# Patient Record
Sex: Female | Born: 1940 | Race: White | Hispanic: No | Marital: Married | Smoking: Never smoker
Health system: Southern US, Community
[De-identification: ages and names within clinical notes are randomized; demographics above are authoritative.]

## PROBLEM LIST (undated history)

## (undated) DIAGNOSIS — Z780 Asymptomatic menopausal state: Secondary | ICD-10-CM

## (undated) DIAGNOSIS — Z9889 Other specified postprocedural states: Secondary | ICD-10-CM

## (undated) DIAGNOSIS — IMO0001 Reserved for inherently not codable concepts without codable children: Secondary | ICD-10-CM

## (undated) DIAGNOSIS — N6310 Unspecified lump in the right breast, unspecified quadrant: Secondary | ICD-10-CM

## (undated) DIAGNOSIS — M8580 Other specified disorders of bone density and structure, unspecified site: Secondary | ICD-10-CM

## (undated) DIAGNOSIS — R112 Nausea with vomiting, unspecified: Secondary | ICD-10-CM

## (undated) DIAGNOSIS — I209 Angina pectoris, unspecified: Secondary | ICD-10-CM

## (undated) DIAGNOSIS — E785 Hyperlipidemia, unspecified: Secondary | ICD-10-CM

## (undated) DIAGNOSIS — K5792 Diverticulitis of intestine, part unspecified, without perforation or abscess without bleeding: Secondary | ICD-10-CM

## (undated) DIAGNOSIS — Z9109 Other allergy status, other than to drugs and biological substances: Secondary | ICD-10-CM

## (undated) DIAGNOSIS — G4730 Sleep apnea, unspecified: Secondary | ICD-10-CM

## (undated) DIAGNOSIS — E669 Obesity, unspecified: Secondary | ICD-10-CM

## (undated) DIAGNOSIS — Z8249 Family history of ischemic heart disease and other diseases of the circulatory system: Secondary | ICD-10-CM

## (undated) DIAGNOSIS — R079 Chest pain, unspecified: Secondary | ICD-10-CM

## (undated) DIAGNOSIS — M1990 Unspecified osteoarthritis, unspecified site: Secondary | ICD-10-CM

## (undated) DIAGNOSIS — I10 Essential (primary) hypertension: Secondary | ICD-10-CM

## (undated) DIAGNOSIS — I499 Cardiac arrhythmia, unspecified: Secondary | ICD-10-CM

## (undated) DIAGNOSIS — G629 Polyneuropathy, unspecified: Secondary | ICD-10-CM

## (undated) DIAGNOSIS — C44320 Squamous cell carcinoma of skin of unspecified parts of face: Secondary | ICD-10-CM

## (undated) DIAGNOSIS — K5900 Constipation, unspecified: Secondary | ICD-10-CM

## (undated) HISTORY — DX: Other allergy status, other than to drugs and biological substances: Z91.09

## (undated) HISTORY — DX: Chest pain, unspecified: R07.9

## (undated) HISTORY — PX: ABDOMINAL HYSTERECTOMY: SHX81

## (undated) HISTORY — DX: Family history of ischemic heart disease and other diseases of the circulatory system: Z82.49

## (undated) HISTORY — DX: Obesity, unspecified: E66.9

## (undated) HISTORY — DX: Squamous cell carcinoma of skin of unspecified parts of face: C44.320

## (undated) HISTORY — DX: Other specified disorders of bone density and structure, unspecified site: M85.80

## (undated) HISTORY — DX: Unspecified lump in the right breast, unspecified quadrant: N63.10

## (undated) HISTORY — PX: EVALUATION UNDER ANESTHESIA WITH STRABISMUS REPAIR: SHX5619

## (undated) HISTORY — DX: Hyperlipidemia, unspecified: E78.5

## (undated) HISTORY — PX: TONSILLECTOMY AND ADENOIDECTOMY: SHX28

## (undated) HISTORY — DX: Polyneuropathy, unspecified: G62.9

## (undated) HISTORY — PX: TONSILLECTOMY: SUR1361

## (undated) HISTORY — DX: Asymptomatic menopausal state: Z78.0

## (undated) HISTORY — PX: COLONOSCOPY: SHX174

## (undated) HISTORY — PX: BREAST EXCISIONAL BIOPSY: SUR124

## (undated) HISTORY — PX: BACK SURGERY: SHX140

## (undated) HISTORY — DX: Sleep apnea, unspecified: G47.30

## (undated) HISTORY — DX: Essential (primary) hypertension: I10

---

## 1984-06-28 HISTORY — PX: ABDOMINAL HYSTERECTOMY: SUR658

## 1997-03-28 HISTORY — PX: BREAST FIBROADENOMA SURGERY: SHX580

## 1998-01-22 ENCOUNTER — Other Ambulatory Visit: Admission: RE | Admit: 1998-01-22 | Discharge: 1998-01-22 | Payer: Self-pay | Admitting: Family Medicine

## 1999-02-05 ENCOUNTER — Other Ambulatory Visit: Admission: RE | Admit: 1999-02-05 | Discharge: 1999-02-05 | Payer: Self-pay | Admitting: *Deleted

## 2001-01-25 ENCOUNTER — Encounter: Payer: Self-pay | Admitting: Family Medicine

## 2001-01-25 ENCOUNTER — Ambulatory Visit (HOSPITAL_COMMUNITY): Admission: RE | Admit: 2001-01-25 | Discharge: 2001-01-25 | Payer: Self-pay | Admitting: Family Medicine

## 2002-02-02 ENCOUNTER — Encounter: Payer: Self-pay | Admitting: Family Medicine

## 2002-02-02 ENCOUNTER — Encounter: Admission: RE | Admit: 2002-02-02 | Discharge: 2002-02-02 | Payer: Self-pay | Admitting: Family Medicine

## 2003-07-08 ENCOUNTER — Encounter: Admission: RE | Admit: 2003-07-08 | Discharge: 2003-07-08 | Payer: Self-pay | Admitting: Family Medicine

## 2005-04-26 ENCOUNTER — Other Ambulatory Visit: Admission: RE | Admit: 2005-04-26 | Discharge: 2005-04-26 | Payer: Self-pay | Admitting: Family Medicine

## 2005-05-14 ENCOUNTER — Ambulatory Visit (HOSPITAL_COMMUNITY): Admission: RE | Admit: 2005-05-14 | Discharge: 2005-05-14 | Payer: Self-pay | Admitting: Family Medicine

## 2005-09-27 ENCOUNTER — Encounter: Admission: RE | Admit: 2005-09-27 | Discharge: 2005-09-27 | Payer: Self-pay | Admitting: Neurosurgery

## 2005-10-26 HISTORY — PX: LUMBAR FUSION: SHX111

## 2005-11-09 ENCOUNTER — Inpatient Hospital Stay (HOSPITAL_COMMUNITY): Admission: RE | Admit: 2005-11-09 | Discharge: 2005-11-10 | Payer: Self-pay | Admitting: Neurosurgery

## 2007-03-29 ENCOUNTER — Other Ambulatory Visit: Admission: RE | Admit: 2007-03-29 | Discharge: 2007-03-29 | Payer: Self-pay | Admitting: Family Medicine

## 2007-12-25 ENCOUNTER — Encounter: Admission: RE | Admit: 2007-12-25 | Discharge: 2007-12-25 | Payer: Self-pay | Admitting: Neurosurgery

## 2008-06-10 ENCOUNTER — Ambulatory Visit (HOSPITAL_COMMUNITY): Admission: RE | Admit: 2008-06-10 | Discharge: 2008-06-10 | Payer: Self-pay | Admitting: Family Medicine

## 2008-06-11 ENCOUNTER — Other Ambulatory Visit: Admission: RE | Admit: 2008-06-11 | Discharge: 2008-06-11 | Payer: Self-pay | Admitting: Family Medicine

## 2010-10-19 ENCOUNTER — Other Ambulatory Visit (HOSPITAL_COMMUNITY): Payer: Self-pay | Admitting: Family Medicine

## 2010-10-19 DIAGNOSIS — Z1231 Encounter for screening mammogram for malignant neoplasm of breast: Secondary | ICD-10-CM

## 2010-10-23 ENCOUNTER — Ambulatory Visit (HOSPITAL_COMMUNITY)
Admission: RE | Admit: 2010-10-23 | Discharge: 2010-10-23 | Disposition: A | Discharge status: Home / Self Care | Payer: Medicare Other | Source: Ambulatory Visit | Attending: Family Medicine | Admitting: Family Medicine

## 2010-10-23 DIAGNOSIS — Z1231 Encounter for screening mammogram for malignant neoplasm of breast: Secondary | ICD-10-CM

## 2010-11-13 NOTE — Op Note (Signed)
Amber Sloan, Amber Sloan              ACCOUNT NO.:  [id number]   MEDICAL RECORD NO.:  [id number]          PATIENT TYPE:  INP   LOCATION:  3025                         FACILITY:  [facility]   PHYSICIAN:  Kathaleen Maser. Pool, M.D.    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   PROCEDURE NAME:  1.  L4-5 decompressive laminectomy with foraminotomies, more than would be      required for simple interbody fusion alone.  2.  L4-5 posterior lumbar interbody fusion utilizing tangent allograft bone,      Telamon interbody cages and local autografting.  3.  L4-5 posterolateral arthrodesis utilizing pedicle screw sedation and      local bone grafting.   SURGEON:  Kathaleen Maser. Pool, M.D.   ASSISTANT:  Reinaldo Meeker, M.D.   ANESTHESIA:  General orotracheal.   INDICATIONS:  Amber Sloan is a 70 year old female with history of back and  bilateral lower extremity, failing conservative measures.  Workup  demonstrates evidence of an unstable grade 1, L4-5 degenerative  spondylolisthesis.  The patient has failed conservative management.  She  presents now for decompression and fusion in the hope of improving her  symptoms.   OPERATIVE NOTE:  The patient taken to the operating room and placed on the  operative table in the supine position after an adequate level of anesthesia  was achieved.  The patient was placed prone onto the Wilson frame, with  appropriate padding provided to the lumbar regions.  She was prepped and  draped sterilely. I made a skin incision overlying the L4-5 interspace.  This carried sharply down sharply in the midline; and a brief suction was  performed at the lamina facet joints L3, L4, and L5; as well as the  transverse processes of  L4 and L5 were very deep.  A self-retaining  retractor was placed.  Intraoperative fluoroscopy was used.  The level was  confirmed.   Decompressive laminectomy was then performed using Leksell rongeurs,  Kerrison rongeurs, and high-speed drill to remove the entire lamina of L4.  The entire inferior facet joint complex of L4 bilaterally, and the superior  facette of L5 bilaterally as well as the superior aspect of the lamina at L5  bilaterally.  All bone is cleaned and used in later autografting.  The  ligamentum flavum is elevated and resected down to fascia using Kerrison  rongeurs for wide decompressive foraminotomies and then carried down the  course exiting the L4 and L5 nerve roots bilaterally.   Epidural venous plexus was then placed.  The patient's left side the thecal  sac and nerve roots were mobilized and retracted towards the midline.  Disk  space then incised with a 15-blade and retractor pressure and wide disk was  then achieved using pituitary rongeurs, operating rongeurs, and Epstein  curettes.   Diskectomy then repeated on the contralateral side.  Disk space was then  sequentially dilated up to 8 mm with an 8 mm distractor left in the  patient's left side.  Thecal sac and nerve root was prepped on the right  side.  Disk space was then reamed and then cut with 8 mm tangent  instruments.  Soft tissue was removed from the interspace.  An 8 x 46 mm  tangent wedge was then packed into place and recessed approximately 2 mm in  the posterior cortical margin of L4.  Thecal sac and nerve was protected on  the patient's left side.  Distractor was removed.  Disk space, once again,  reamed and then cut with 8 mm tangent instrument.  Soft tissue was removed  from the interspace.  The disk space was further curettaged.  Morselized  autograft was then packed into the interspace.  An 8 x 26 mm Telamon cage  filled with interbody autograft was then packed into place; and recessed   approximately 2 mm from the posterior cortical margin of L4.  Pedicles at L4  and L5 were then decorticated using high-speed drill.  Each pedicle was then  probed using pedicle awl.  Each pedicle awl track was then probed and found  be solid in bone.  Each pedicle awl tract was then tapped with a 5.25 mm  screw tapper.  Once again this was probed and found to be solidly within  bone.  A 6.75 x 45 mm spiral 90 D screw was placed bilaterally at L4.  A  6.75 x 40 mm screw was placed bilaterally at L5.   Transverse processes at L5 and L4 were then decorticated using high-speed  drill.  Morselized autograft was packed posterolaterally for later fusion.  A short segment of titanium rod was then contoured and placed over the screw  heads at L4 and L5.  Locking caps were then placed over the screw heads.  Locking caps then engaged, construct under compression.  Final images  revealed good position of the bone grafts femoral, proper ulnarward and  proper leftward.  The wound is then irrigated with antibiotic solution.  Hemostasis found to be good.  A medium Hemovac drain was left in the  epidural space and the wound was closed in closed in layers with Vicryl  sutures.  Steri-Strips and sterile dressings were applied.  There were no  operative complications.  The patient well and she returns to the recovery  room postoperatively.           ______________________________  Kathaleen Maser Pool, M.D.     HAP/MEDQ  D:  11/09/2005  T:  11/09/2005  Job:  [id number]

## 2012-02-18 ENCOUNTER — Other Ambulatory Visit (HOSPITAL_COMMUNITY): Payer: Self-pay | Admitting: Family Medicine

## 2012-02-18 DIAGNOSIS — Z1231 Encounter for screening mammogram for malignant neoplasm of breast: Secondary | ICD-10-CM

## 2012-03-21 ENCOUNTER — Ambulatory Visit (HOSPITAL_COMMUNITY)
Admission: RE | Admit: 2012-03-21 | Discharge: 2012-03-21 | Disposition: A | Discharge status: Home / Self Care | Payer: Medicare Other | Source: Ambulatory Visit | Attending: Family Medicine | Admitting: Family Medicine

## 2012-03-21 DIAGNOSIS — Z1231 Encounter for screening mammogram for malignant neoplasm of breast: Secondary | ICD-10-CM | POA: Insufficient documentation

## 2012-04-12 ENCOUNTER — Other Ambulatory Visit: Payer: Self-pay | Admitting: Neurosurgery

## 2012-04-12 DIAGNOSIS — M48061 Spinal stenosis, lumbar region without neurogenic claudication: Secondary | ICD-10-CM

## 2012-04-18 ENCOUNTER — Ambulatory Visit
Admission: RE | Admit: 2012-04-18 | Discharge: 2012-04-18 | Disposition: A | Discharge status: Home / Self Care | Payer: Medicare Other | Source: Ambulatory Visit | Attending: Neurosurgery | Admitting: Neurosurgery

## 2012-04-18 DIAGNOSIS — M48061 Spinal stenosis, lumbar region without neurogenic claudication: Secondary | ICD-10-CM

## 2012-04-18 MED ORDER — GADOBENATE DIMEGLUMINE 529 MG/ML IV SOLN
17.0000 mL | Freq: Once | INTRAVENOUS | Status: AC | PRN
  Administered 2012-04-18: 17 mL via INTRAVENOUS

## 2012-05-11 ENCOUNTER — Other Ambulatory Visit: Payer: Self-pay | Admitting: Family Medicine

## 2012-05-11 DIAGNOSIS — M25562 Pain in left knee: Secondary | ICD-10-CM

## 2012-05-17 ENCOUNTER — Ambulatory Visit
Admission: RE | Admit: 2012-05-17 | Discharge: 2012-05-17 | Disposition: A | Discharge status: Home / Self Care | Payer: Medicare Other | Source: Ambulatory Visit | Attending: Family Medicine | Admitting: Family Medicine

## 2012-05-17 DIAGNOSIS — M25562 Pain in left knee: Secondary | ICD-10-CM

## 2012-05-18 ENCOUNTER — Other Ambulatory Visit: Payer: Medicare Other

## 2013-07-04 ENCOUNTER — Other Ambulatory Visit (HOSPITAL_COMMUNITY): Payer: Self-pay | Admitting: Family Medicine

## 2013-07-04 DIAGNOSIS — Z1231 Encounter for screening mammogram for malignant neoplasm of breast: Secondary | ICD-10-CM

## 2013-07-13 ENCOUNTER — Ambulatory Visit (HOSPITAL_COMMUNITY): Payer: Medicare Other

## 2013-07-18 ENCOUNTER — Ambulatory Visit (HOSPITAL_COMMUNITY)
Admission: RE | Admit: 2013-07-18 | Discharge: 2013-07-18 | Disposition: A | Discharge status: Home / Self Care | Payer: Medicare Other | Source: Ambulatory Visit | Attending: Family Medicine | Admitting: Family Medicine

## 2013-07-18 DIAGNOSIS — Z1231 Encounter for screening mammogram for malignant neoplasm of breast: Secondary | ICD-10-CM | POA: Insufficient documentation

## 2013-07-28 ENCOUNTER — Encounter: Payer: Self-pay | Admitting: *Deleted

## 2013-10-23 ENCOUNTER — Other Ambulatory Visit: Payer: Self-pay | Admitting: Physician Assistant

## 2013-10-23 DIAGNOSIS — M542 Cervicalgia: Secondary | ICD-10-CM

## 2013-11-01 ENCOUNTER — Ambulatory Visit
Admission: RE | Admit: 2013-11-01 | Discharge: 2013-11-01 | Disposition: A | Discharge status: Home / Self Care | Payer: Medicare Other | Source: Ambulatory Visit | Attending: Physician Assistant | Admitting: Physician Assistant

## 2013-11-01 DIAGNOSIS — M542 Cervicalgia: Secondary | ICD-10-CM

## 2013-11-20 ENCOUNTER — Ambulatory Visit (INDEPENDENT_AMBULATORY_CARE_PROVIDER_SITE_OTHER): Payer: Medicare Other | Admitting: Cardiovascular Disease

## 2013-11-20 ENCOUNTER — Encounter: Payer: Self-pay | Admitting: Cardiovascular Disease

## 2013-11-20 VITALS — BP 130/60 | HR 76 | Ht 64.0 in | Wt 178.8 lb

## 2013-11-20 DIAGNOSIS — R0602 Shortness of breath: Secondary | ICD-10-CM

## 2013-11-20 DIAGNOSIS — I10 Essential (primary) hypertension: Secondary | ICD-10-CM | POA: Insufficient documentation

## 2013-11-20 DIAGNOSIS — E785 Hyperlipidemia, unspecified: Secondary | ICD-10-CM | POA: Insufficient documentation

## 2013-11-20 DIAGNOSIS — R079 Chest pain, unspecified: Secondary | ICD-10-CM

## 2013-11-20 MED ORDER — AMLODIPINE BESYLATE 2.5 MG PO TABS: 2.5000 mg | ORAL_TABLET | Freq: Every day | ORAL | Status: DC

## 2013-11-20 NOTE — Assessment & Plan Note (Signed)
New-onset chest pain over the last 2 weeks occurring several times a day lasting up to 45 minutes at a time. She does have risk factors including hypertension and hyperlipidemia as well as a strong family history for heart disease. She has a normal resting baseline EKG. She is unable to exercise. Amber Sloan get a 2-D echocardiogram and a Myoview stress test since she is at moderate increased risk for CAD.

## 2013-11-20 NOTE — Progress Notes (Signed)
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 She was referred by Dr. Kelton Pillar for cardiovascular evaluation because of new-onset angina. Her cardiac risk factors include treated hypertension, and hyperlipidemia. She has a strong family history of heart disease with both parents had myocardial infarctions, brother who died at age 37 of a myocardial infarction at 50 other brothers who have CAD. She had new-onset chest pain positive bruits ago occurring several times a day lasting several minutes at a time.   Current Outpatient Prescriptions  Medication Sig Dispense Refill  . benazepril-hydrochlorthiazide (LOTENSIN HCT) 10-12.5 MG per tablet Take 1 tablet by mouth every morning.      . etodolac (LODINE) 400 MG tablet Take 1 tablet by mouth 2 (two) times daily.      . raloxifene (EVISTA) 60 MG tablet Take 60 mg by mouth daily.      . simvastatin (ZOCOR) 20 MG tablet Take 20 mg by mouth daily.      Marland Kitchen amLODipine (NORVASC) 2.5 MG tablet Take 1 tablet (2.5 mg total) by mouth daily.  180 tablet  3   No current facility-administered medications for this visit.    Allergies  Allergen Reactions  . Parafon Forte Dsc [Chlorzoxazone]   . Sulfa Antibiotics     Drugs    History   Social History  . Marital Status: Married    Spouse Name: N/A    Number of Children: N/A  . Years of Education: N/A   Occupational History  . Not on file.   Social History Main Topics  . Smoking status: Never Smoker   . Smokeless tobacco: Not on file  . Alcohol Use: Not on file  . Drug Use: Not on file  . Sexual Activity: Not on file   Other Topics Concern  . Not on file   Social History Narrative  . No narrative  on file     Review of Systems: General: negative for chills, fever, night sweats or weight changes.  Cardiovascular: negative for chest pain, dyspnea on exertion, edema, orthopnea, palpitations, paroxysmal nocturnal dyspnea or shortness of breath Dermatological: negative for rash Respiratory: negative for cough or wheezing Urologic: negative for hematuria Abdominal: negative for nausea, vomiting, diarrhea, bright red blood per rectum, melena, or hematemesis Neurologic: negative for visual changes, syncope, or dizziness All other systems reviewed and are otherwise negative except as noted above.    Blood pressure 130/60, pulse 76, height 5\' 4"  (1.626 m), weight 178 lb 12.8 oz (81.103 kg).  General appearance: alert and no distress Neck: no adenopathy, no carotid bruit, no JVD, supple, symmetrical, trachea midline and thyroid not enlarged, symmetric, no tenderness/mass/nodules Lungs: clear to auscultation bilaterally Heart: regular rate and rhythm, S1, S2 normal, no murmur, click, rub or gallop Extremities: extremities normal, atraumatic, no cyanosis or edema and 2+ pedal pulses bilaterally  EKG performed by her primary care physician revealing sinus rhythm without ST or T wave changes  ASSESSMENT AND PLAN:   Hyperlipidemia On statin therapy followed by her PCP  Essential hypertension Well-controlled on current medications  Chest pain New-onset chest pain over the last 2 weeks occurring several times a day lasting up to 45 minutes at a time. She does have risk factors including hypertension and  hyperlipidemia as well as a strong family history for heart disease. She has a normal resting baseline EKG. She is unable to exercise. Elberta Fortis get a 2-D echocardiogram and a Myoview stress test since she is at moderate increased risk for CAD.      Amber Harp MD FACP,FACC,FAHA, [facility] 11/20/2013 4:20 PM

## 2013-11-20 NOTE — Assessment & Plan Note (Signed)
On statin therapy followed by her PCP 

## 2013-11-20 NOTE — Patient Instructions (Signed)
  We will see you back in follow up on Friday, 5/29  Dr Gwenlyn Found has ordered : 1.  Echocardiogram. Echocardiography is a painless test that uses sound waves to create images of your heart. It provides your doctor with information about the size and shape of your heart and how well your heart's chambers and valves are working. This procedure takes approximately one hour. There are no restrictions for this procedure.   2. Lexiscan Myoview- this is a test that looks at the blood flow to your heart muscle.  It takes approximately 2 1/2 hours. Please follow instruction sheet, as given.   3. Start Amlodipine 2.5 mg daily

## 2013-11-20 NOTE — Assessment & Plan Note (Signed)
Well-controlled on current medications 

## 2013-11-21 ENCOUNTER — Telehealth (HOSPITAL_COMMUNITY): Payer: Self-pay | Admitting: *Deleted

## 2013-11-26 ENCOUNTER — Telehealth (HOSPITAL_COMMUNITY): Payer: Self-pay

## 2013-11-27 ENCOUNTER — Ambulatory Visit (HOSPITAL_COMMUNITY)
Admission: RE | Admit: 2013-11-27 | Discharge: 2013-11-27 | Disposition: A | Discharge status: Home / Self Care | Payer: Medicare Other | Source: Ambulatory Visit | Attending: Cardiovascular Disease | Admitting: Cardiovascular Disease

## 2013-11-27 ENCOUNTER — Ambulatory Visit (HOSPITAL_COMMUNITY)
Admission: RE | Admit: 2013-11-27 | Discharge: 2013-11-27 | Disposition: A | Discharge status: Home / Self Care | Payer: Medicare Other | Source: Ambulatory Visit | Attending: Internal Medicine | Admitting: Internal Medicine

## 2013-11-27 DIAGNOSIS — R42 Dizziness and giddiness: Secondary | ICD-10-CM | POA: Insufficient documentation

## 2013-11-27 DIAGNOSIS — I519 Heart disease, unspecified: Secondary | ICD-10-CM

## 2013-11-27 DIAGNOSIS — R0602 Shortness of breath: Secondary | ICD-10-CM

## 2013-11-27 DIAGNOSIS — R5381 Other malaise: Secondary | ICD-10-CM | POA: Insufficient documentation

## 2013-11-27 DIAGNOSIS — R079 Chest pain, unspecified: Secondary | ICD-10-CM | POA: Insufficient documentation

## 2013-11-27 DIAGNOSIS — R5383 Other fatigue: Secondary | ICD-10-CM

## 2013-11-27 MED ORDER — TECHNETIUM TC 99M SESTAMIBI GENERIC - CARDIOLITE
10.9000 | Freq: Once | INTRAVENOUS | Status: AC | PRN
  Administered 2013-11-27: 10.9 via INTRAVENOUS

## 2013-11-27 MED ORDER — TECHNETIUM TC 99M SESTAMIBI GENERIC - CARDIOLITE
30.8000 | Freq: Once | INTRAVENOUS | Status: AC | PRN
  Administered 2013-11-27: 30.8 via INTRAVENOUS

## 2013-11-27 MED ORDER — REGADENOSON 0.4 MG/5ML IV SOLN
0.4000 mg | Freq: Once | INTRAVENOUS | Status: AC
  Administered 2013-11-27: 0.4 mg via INTRAVENOUS

## 2013-11-27 MED ORDER — AMINOPHYLLINE 25 MG/ML IV SOLN
75.0000 mg | Freq: Once | INTRAVENOUS | Status: AC
  Administered 2013-11-27: 75 mg via INTRAVENOUS

## 2013-11-27 NOTE — Procedures (Addendum)
[facility] [address] [place] [address] [phone]  Cardiology Nuclear Med Study  Amber Sloan is a 73 y.o. female     MRN : [id number]     DOB: 1941-03-22  Procedure Date: 11/27/2013  Nuclear Med Background Indication for Stress Test:  Evaluation for Ischemia History:  No prior cardiac or respiratory history reported; No prior NUC MPI fo rcomparison. Cardiac Risk Factors: Family History - CAD, Hypertension, Lipids and Overweight  Symptoms:  Chest Pain, Fatigue and Light-Headedness   Nuclear Pre-Procedure Caffeine/Decaff Intake:  9:00pm NPO After: 7:00am   IV Site: R Forearm  IV 0.9% NS with Angio Cath:  22g  Chest Size (in):  n/a IV Started by: Rolene Course, RN  Height: 5\' 4"  (1.626 m)  Cup Size: C  BMI:  Body mass index is 30.54 kg/(m^2). Weight:  178 lb (80.74 kg)   Tech Comments:  n/a    Nuclear Med Study 1 or 2 day study: 1 day  Stress Test Type:  [place] Provider:  Quay Burow, MD   Resting Radionuclide: Technetium 2m Sestamibi  Resting Radionuclide Dose: 10.9 mCi   Stress Radionuclide:  Technetium 44m Sestamibi  Stress Radionuclide Dose: 30.8 mCi           Stress Protocol Rest HR: 62 Stress HR: 96  Rest BP: 130/74 Stress BP: 152/62  Exercise Time (min): n/a METS: n/a   Predicted Max HR: 150 bpm % Max HR: 64 bpm Rate Pressure Product: 15072  Dose of Adenosine (mg):  n/a Dose of Lexiscan: 0.4 mg  Dose of Atropine (mg): n/a Dose of Dobutamine: n/a mcg/kg/min (at max HR)  Stress Test Technologist: Leane Para, CCT Nuclear Technologist: Imagene Riches, CNMT   Rest Procedure:  Myocardial perfusion imaging was performed at rest 45 minutes following the intravenous administration of Technetium 9m Sestamibi. Stress Procedure:  The patient received IV Lexiscan 0.4 mg over 15-seconds.  Technetium 69m Sestamibi injected at 30-seconds.  Patient experienced SOB and funny  feeling in chest and 75 mg of Aminophylline IV was administered at 5 minutes.  There were no significant changes with Lexiscan.  Quantitative spect images were obtained after a 45 minute delay.  Transient Ischemic Dilatation (Normal <1.22):  0.99 Lung/Heart Ratio (Normal <0.45):  0.39 QGS EDV:  57 ml QGS ESV:  14 ml LV Ejection Fraction: 76%     Rest ECG: NSR - Normal EKG  Stress ECG: No significant change from baseline ECG  QPS Raw Data Images:  Normal; no motion artifact; normal heart/lung ratio. Stress Images:  Normal homogeneous uptake in all areas of the myocardium. Rest Images:  Normal homogeneous uptake in all areas of the myocardium. Subtraction (SDS):  No evidence of ischemia. LV Wall Motion:  NL LV Function; NL Wall Motion  Impression Exercise Capacity:  Lexiscan with no exercise. BP Response:  Normal blood pressure response. Clinical Symptoms:  No significant symptoms noted. ECG Impression:  No significant ECG changes with Lexiscan. Comparison with Prior Nuclear Study: No previous nuclear study performed   Overall Impression:  Normal stress nuclear study.   Sanda Klein, MD  11/27/2013 5:47 PM

## 2013-11-27 NOTE — Progress Notes (Signed)
2D Echo Performed 11/27/2013    Marygrace Drought, RCS

## 2013-12-04 ENCOUNTER — Encounter: Payer: Self-pay | Admitting: *Deleted

## 2013-12-05 ENCOUNTER — Telehealth: Payer: Self-pay | Admitting: Cardiovascular Disease

## 2013-12-05 NOTE — Telephone Encounter (Signed)
°

## 2013-12-05 NOTE — Telephone Encounter (Signed)
Patient notified of normal echo and informed her that letter was mailed yesterday 6/9

## 2013-12-19 ENCOUNTER — Other Ambulatory Visit: Payer: Self-pay | Admitting: Gastroenterology

## 2013-12-19 DIAGNOSIS — R109 Unspecified abdominal pain: Secondary | ICD-10-CM

## 2013-12-21 ENCOUNTER — Ambulatory Visit
Admission: RE | Admit: 2013-12-21 | Discharge: 2013-12-21 | Disposition: A | Discharge status: Home / Self Care | Payer: Medicare Other | Source: Ambulatory Visit | Attending: Gastroenterology | Admitting: Gastroenterology

## 2013-12-21 DIAGNOSIS — R109 Unspecified abdominal pain: Secondary | ICD-10-CM

## 2013-12-26 NOTE — Telephone Encounter (Signed)
Encounter complete. 

## 2014-02-26 ENCOUNTER — Other Ambulatory Visit: Payer: Self-pay | Admitting: Gastroenterology

## 2014-02-26 DIAGNOSIS — R1013 Epigastric pain: Secondary | ICD-10-CM

## 2014-03-07 ENCOUNTER — Ambulatory Visit
Admission: RE | Admit: 2014-03-07 | Discharge: 2014-03-07 | Disposition: A | Discharge status: Home / Self Care | Payer: Medicare Other | Source: Ambulatory Visit | Attending: Gastroenterology | Admitting: Gastroenterology

## 2014-03-07 DIAGNOSIS — R1013 Epigastric pain: Secondary | ICD-10-CM

## 2014-04-16 ENCOUNTER — Other Ambulatory Visit (INDEPENDENT_AMBULATORY_CARE_PROVIDER_SITE_OTHER): Payer: Self-pay | Admitting: General Surgery

## 2014-05-08 NOTE — Pre-Procedure Instructions (Signed)
Kameryn Davern Baiz  05/08/2014   Your procedure is scheduled on:  Thurs, Nov 19 @ 12:00 PM  Report to [facility] En[facility]  at 10:00 AM.  Call this number if you have problems the morning of surgery: [phone]   Remember:   Do not eat food or drink liquids after midnight.   Take these medicines the morning of surgery with A SIP OF WATER: Amlodipine(Norvasc)              Stop taking your Vitamins and any Herbals. No Goody's,BC's,Aleve,Aspirin,Ibuprofen,and Fish Oil.    Do not wear jewelry, make-up or nail polish.  Do not wear lotions, powders, or perfumes. You may wear deodorant.  Do not shave 48 hours prior to surgery.   Do not bring valuables to the hospital.  [facility] is not responsible                  for any belongings or valuables.               Contacts, dentures or bridgework may not be worn into surgery.  Leave suitcase in the car. After surgery it may be brought to your room.  For patients admitted to the hospital, discharge time is determined by your                treatment team.               Patients discharged the day of surgery will not be allowed to drive  home.    Special Instructions:  [facility] - Preparing for Surgery  Before surgery, you can play an important role.  Because skin is not sterile, your skin needs to be as free of germs as possible.  You can reduce the number of germs on you skin by washing with CHG (chlorahexidine gluconate) soap before surgery.  CHG is an antiseptic cleaner which kills germs and bonds with the skin to continue killing germs even after washing.  Please DO NOT use if you have an allergy to CHG or antibacterial soaps.  If your skin becomes reddened/irritated stop using the CHG and inform your nurse when you arrive at Short Stay.  Do not shave (including legs and underarms) for at least 48 hours prior to the first CHG shower.  You may shave your face.  Please follow these instructions carefully:   1.  Shower with CHG Soap the  night before surgery and the                                morning of Surgery.  2.  If you choose to wash your hair, wash your hair first as usual with your       normal shampoo.  3.  After you shampoo, rinse your hair and body thoroughly to remove the                      Shampoo.  4.  Use CHG as you would any other liquid soap.  You can apply chg directly       to the skin and wash gently with scrungie or a clean washcloth.  5.  Apply the CHG Soap to your body ONLY FROM THE NECK DOWN.        Do not use on open wounds or open sores.  Avoid contact with your eyes,       ears, mouth  and genitals (private parts).  Wash genitals (private parts)       with your normal soap.  6.  Wash thoroughly, paying special attention to the area where your surgery        will be performed.  7.  Thoroughly rinse your body with warm water from the neck down.  8.  DO NOT shower/wash with your normal soap after using and rinsing off       the CHG Soap.  9.  Pat yourself dry with a clean towel.            10.  Wear clean pajamas.            11.  Place clean sheets on your bed the night of your first shower and do not        sleep with pets.  Day of Surgery  Do not apply any lotions/deoderants the morning of surgery.  Please wear clean clothes to the hospital/surgery center.     Please read over the following fact sheets that you were given: Pain Booklet, Coughing and Deep Breathing and Surgical Site Infection Prevention

## 2014-05-09 ENCOUNTER — Encounter (HOSPITAL_COMMUNITY): Payer: Self-pay

## 2014-05-09 ENCOUNTER — Encounter (HOSPITAL_COMMUNITY)
Admission: RE | Admit: 2014-05-09 | Discharge: 2014-05-09 | Disposition: A | Discharge status: Home / Self Care | Payer: Medicare Other | Source: Ambulatory Visit | Attending: General Surgery | Admitting: General Surgery

## 2014-05-09 DIAGNOSIS — Z01812 Encounter for preprocedural laboratory examination: Secondary | ICD-10-CM | POA: Insufficient documentation

## 2014-05-09 DIAGNOSIS — K8020 Calculus of gallbladder without cholecystitis without obstruction: Secondary | ICD-10-CM | POA: Insufficient documentation

## 2014-05-09 HISTORY — DX: Other specified postprocedural states: Z98.890

## 2014-05-09 HISTORY — DX: Angina pectoris, unspecified: I20.9

## 2014-05-09 HISTORY — DX: Constipation, unspecified: K59.00

## 2014-05-09 HISTORY — DX: Reserved for inherently not codable concepts without codable children: IMO0001

## 2014-05-09 HISTORY — DX: Cardiac arrhythmia, unspecified: I49.9

## 2014-05-09 HISTORY — DX: Nausea with vomiting, unspecified: R11.2

## 2014-05-09 HISTORY — DX: Diverticulitis of intestine, part unspecified, without perforation or abscess without bleeding: K57.92

## 2014-05-09 HISTORY — DX: Unspecified osteoarthritis, unspecified site: M19.90

## 2014-05-09 LAB — COMPREHENSIVE METABOLIC PANEL
ALT: 17 U/L (ref 0–35)
AST: 28 U/L (ref 0–37)
Albumin: 3.7 g/dL (ref 3.5–5.2)
Alkaline Phosphatase: 82 U/L (ref 39–117)
Anion gap: 15 (ref 5–15)
BUN: 11 mg/dL (ref 6–23)
CALCIUM: 9.3 mg/dL (ref 8.4–10.5)
CO2: 25 mEq/L (ref 19–32)
Chloride: 98 mEq/L (ref 96–112)
Creatinine, Ser: 0.69 mg/dL (ref 0.50–1.10)
GFR calc non Af Amer: 86 mL/min — ABNORMAL LOW (ref >90)
GLUCOSE: 97 mg/dL (ref 70–99)
Potassium: 4.2 mEq/L (ref 3.7–5.3)
Sodium: 138 mEq/L (ref 137–147)
TOTAL PROTEIN: 7 g/dL (ref 6.0–8.3)
Total Bilirubin: 0.3 mg/dL (ref 0.3–1.2)

## 2014-05-09 LAB — CBC WITH DIFFERENTIAL/PLATELET
Basophils Absolute: 0 10*3/uL (ref 0.0–0.1)
Basophils Relative: 1 % (ref 0–1)
EOS ABS: 0.1 10*3/uL (ref 0.0–0.7)
EOS PCT: 2 % (ref 0–5)
HEMATOCRIT: 39.4 % (ref 36.0–46.0)
Hemoglobin: 13.1 g/dL (ref 12.0–15.0)
LYMPHS ABS: 3.1 10*3/uL (ref 0.7–4.0)
Lymphocytes Relative: 39 % (ref 12–46)
MCH: 29 pg (ref 26.0–34.0)
MCHC: 33.2 g/dL (ref 30.0–36.0)
MCV: 87.2 fL (ref 78.0–100.0)
MONO ABS: 0.6 10*3/uL (ref 0.1–1.0)
Monocytes Relative: 8 % (ref 3–12)
Neutro Abs: 4.1 10*3/uL (ref 1.7–7.7)
Neutrophils Relative %: 50 % (ref 43–77)
Platelets: 208 10*3/uL (ref 150–400)
RBC: 4.52 MIL/uL (ref 3.87–5.11)
RDW: 13.6 % (ref 11.5–15.5)
WBC: 7.9 10*3/uL (ref 4.0–10.5)

## 2014-05-09 LAB — PROTIME-INR
INR: 0.93 (ref 0.00–1.49)
Prothrombin Time: 12.6 seconds (ref 11.6–15.2)

## 2014-05-09 NOTE — Progress Notes (Signed)
Mrs Qualley reported new onset "flutter feeling  and heart speeds up, I get light headed and short of breath."  "Last about 5 min and happens about once a week."  Patient said that she does do anything that presipaptes it. Mrs Poulson saw Dr Gwenlyn Found for new onset of chest pain in May , had a stress test and echo in June that were normal.  I notified Allizion Z, she said to instruct patient , if it happens more frequently, last longer or sym,ptoms get worse, that patient will need to have follow up prior to OR.  Mrs Ethel Rana has an appoint with PCP Dr Johnette Abraham. Laurann Montana  05/13/14.

## 2014-05-10 NOTE — Progress Notes (Signed)
Anesthesia Chart Review:  Patient is a 73 year old female scheduled for laparoscopic cholecystectomy on 05/16/14 by Dr. Zella Richer.    History includes non-smoker, HTN, dysrhythmia (palpitations), OSA on CPAP, exertional dyspnea, SCC of the face, left peripheral neuropathy, right breast surgery for fibroadenoma, T&A, lumbar fusion, post-operative N/V. PCP is Dr. Kelton Pillar.    She was evaluated by cardiologist Dr. Gwenlyn Found in 10/2013 for chest pain and had a normal stress test and unremarkable echo.  At her PAT visit she reported intermittent episodes of palpitations that last up to 5 minutes. No syncope. No symptoms at PAT.  She was advised to follow-up with her PCP or cardiologist if episodes persist or she become more asymptomatic. She reported having a visit with Dr. Laurann Montana on 05/13/14.   Meds: Flax seed, garlic, glucosamine, magnesium, MVI, Tylenol arthritis, amlodipine, Lotensin-HCT, Claritin-D, Evista, simvastatin.  EKG on 11/08/13: SB at 56 bpm, poor r wave progression, non-specific consider old anterior infarct. HR was 68 bpm at PAT, BP 108/56.  She had a normal stress nuclear study on 11/27/13.    Echo on 11/27/13: Normal LV size and systolic function, EF 09-73%. Wall motion was normal;there were no regional wall motion abnormalities. Dopplerparameters are consistent with abnormal left ventricularrelaxation (grade 1 diastolic dysfunction). Normal RV sizeand systolic function. No significant valvular abnormalities.  Preoperative labs noted.   She had reassuring cardiac studies earlier this year.  She does get palpitations but currently not sustained.  If no acute changes or new arrhythmias then I would anticipate that she could proceed as planned.  George Hugh [facility] Short Stay Center/Anesthesiology Phone [phone] 05/10/2014 6:06 PM

## 2014-05-15 MED ORDER — CEFAZOLIN SODIUM-DEXTROSE 2-3 GM-% IV SOLR
2.0000 g | INTRAVENOUS | Status: AC
  Administered 2014-05-16: 2 g via INTRAVENOUS
  Filled 2014-05-15: qty 50

## 2014-05-16 ENCOUNTER — Ambulatory Visit (HOSPITAL_COMMUNITY)
Admission: RE | Admit: 2014-05-16 | Discharge: 2014-05-16 | Disposition: A | Discharge status: Home / Self Care | Payer: Medicare Other | Source: Ambulatory Visit | Attending: General Surgery | Admitting: General Surgery

## 2014-05-16 ENCOUNTER — Ambulatory Visit (HOSPITAL_COMMUNITY): Payer: Medicare Other

## 2014-05-16 ENCOUNTER — Ambulatory Visit (HOSPITAL_COMMUNITY): Payer: Medicare Other | Admitting: Anesthesiology

## 2014-05-16 ENCOUNTER — Encounter (HOSPITAL_COMMUNITY): Payer: Self-pay | Admitting: Anesthesiology

## 2014-05-16 ENCOUNTER — Encounter (HOSPITAL_COMMUNITY)
Admission: RE | Disposition: A | Discharge status: Home / Self Care | Payer: Self-pay | Source: Ambulatory Visit | Attending: General Surgery

## 2014-05-16 ENCOUNTER — Ambulatory Visit (HOSPITAL_COMMUNITY): Payer: Medicare Other | Admitting: Vascular Surgery

## 2014-05-16 DIAGNOSIS — Z79899 Other long term (current) drug therapy: Secondary | ICD-10-CM | POA: Diagnosis not present

## 2014-05-16 DIAGNOSIS — I10 Essential (primary) hypertension: Secondary | ICD-10-CM | POA: Diagnosis not present

## 2014-05-16 DIAGNOSIS — G4730 Sleep apnea, unspecified: Secondary | ICD-10-CM | POA: Diagnosis not present

## 2014-05-16 DIAGNOSIS — E780 Pure hypercholesterolemia: Secondary | ICD-10-CM | POA: Insufficient documentation

## 2014-05-16 DIAGNOSIS — K8010 Calculus of gallbladder with chronic cholecystitis without obstruction: Secondary | ICD-10-CM | POA: Diagnosis not present

## 2014-05-16 DIAGNOSIS — M1990 Unspecified osteoarthritis, unspecified site: Secondary | ICD-10-CM | POA: Diagnosis not present

## 2014-05-16 DIAGNOSIS — K5790 Diverticulosis of intestine, part unspecified, without perforation or abscess without bleeding: Secondary | ICD-10-CM | POA: Insufficient documentation

## 2014-05-16 DIAGNOSIS — K8020 Calculus of gallbladder without cholecystitis without obstruction: Secondary | ICD-10-CM

## 2014-05-16 HISTORY — PX: CHOLECYSTECTOMY: SHX55

## 2014-05-16 SURGERY — LAPAROSCOPIC CHOLECYSTECTOMY WITH INTRAOPERATIVE CHOLANGIOGRAM
Anesthesia: General

## 2014-05-16 MED ORDER — PROPOFOL 10 MG/ML IV BOLUS
INTRAVENOUS | Status: DC | PRN
  Administered 2014-05-16: 130 mg via INTRAVENOUS

## 2014-05-16 MED ORDER — SODIUM CHLORIDE 0.9 % IV SOLN
INTRAVENOUS | Status: DC | PRN
  Administered 2014-05-16: 12 mL

## 2014-05-16 MED ORDER — FENTANYL CITRATE 0.05 MG/ML IJ SOLN
25.0000 ug | INTRAMUSCULAR | Status: DC | PRN
  Administered 2014-05-16 (×2): 50 ug via INTRAVENOUS

## 2014-05-16 MED ORDER — DEXAMETHASONE SODIUM PHOSPHATE 4 MG/ML IJ SOLN
INTRAMUSCULAR | Status: AC
  Filled 2014-05-16: qty 1

## 2014-05-16 MED ORDER — ONDANSETRON HCL 4 MG/2ML IJ SOLN
INTRAMUSCULAR | Status: DC | PRN
  Administered 2014-05-16: 4 mg via INTRAVENOUS

## 2014-05-16 MED ORDER — MEPERIDINE HCL 25 MG/ML IJ SOLN: 6.2500 mg | INTRAMUSCULAR | Status: DC | PRN

## 2014-05-16 MED ORDER — SCOPOLAMINE 1 MG/3DAYS TD PT72
1.0000 | MEDICATED_PATCH | TRANSDERMAL | Status: DC
  Administered 2014-05-16: 1.5 mg via TRANSDERMAL
  Filled 2014-05-16: qty 1

## 2014-05-16 MED ORDER — MIDAZOLAM HCL 2 MG/2ML IJ SOLN
INTRAMUSCULAR | Status: AC
  Filled 2014-05-16: qty 2

## 2014-05-16 MED ORDER — PHENYLEPHRINE 40 MCG/ML (10ML) SYRINGE FOR IV PUSH (FOR BLOOD PRESSURE SUPPORT)
PREFILLED_SYRINGE | INTRAVENOUS | Status: AC
  Filled 2014-05-16: qty 10

## 2014-05-16 MED ORDER — FENTANYL CITRATE 0.05 MG/ML IJ SOLN
INTRAMUSCULAR | Status: AC
  Filled 2014-05-16: qty 5

## 2014-05-16 MED ORDER — ROCURONIUM BROMIDE 50 MG/5ML IV SOLN
INTRAVENOUS | Status: AC
  Filled 2014-05-16: qty 1

## 2014-05-16 MED ORDER — HYDROCODONE-ACETAMINOPHEN 5-325 MG PO TABS: 1.0000 | ORAL_TABLET | ORAL | Status: DC | PRN

## 2014-05-16 MED ORDER — LACTATED RINGERS IV SOLN
INTRAVENOUS | Status: DC | PRN
  Administered 2014-05-16 (×2): via INTRAVENOUS

## 2014-05-16 MED ORDER — GLYCOPYRROLATE 0.2 MG/ML IJ SOLN
INTRAMUSCULAR | Status: DC | PRN
  Administered 2014-05-16: 0.4 mg via INTRAVENOUS

## 2014-05-16 MED ORDER — LIDOCAINE HCL (CARDIAC) 20 MG/ML IV SOLN
INTRAVENOUS | Status: DC | PRN
  Administered 2014-05-16: 60 mg via INTRAVENOUS

## 2014-05-16 MED ORDER — DEXAMETHASONE SODIUM PHOSPHATE 4 MG/ML IJ SOLN
INTRAMUSCULAR | Status: DC | PRN
  Administered 2014-05-16: 4 mg via INTRAVENOUS

## 2014-05-16 MED ORDER — GLYCOPYRROLATE 0.2 MG/ML IJ SOLN
INTRAMUSCULAR | Status: AC
  Filled 2014-05-16: qty 3

## 2014-05-16 MED ORDER — LACTATED RINGERS IV SOLN
INTRAVENOUS | Status: DC
  Administered 2014-05-16: 11:00:00 via INTRAVENOUS

## 2014-05-16 MED ORDER — PROMETHAZINE HCL 25 MG/ML IJ SOLN: 6.2500 mg | INTRAMUSCULAR | Status: DC | PRN

## 2014-05-16 MED ORDER — ONDANSETRON HCL 4 MG/2ML IJ SOLN
INTRAMUSCULAR | Status: AC
  Filled 2014-05-16: qty 2

## 2014-05-16 MED ORDER — LIDOCAINE HCL (CARDIAC) 20 MG/ML IV SOLN
INTRAVENOUS | Status: AC
  Filled 2014-05-16: qty 5

## 2014-05-16 MED ORDER — BUPIVACAINE-EPINEPHRINE (PF) 0.25% -1:200000 IJ SOLN
INTRAMUSCULAR | Status: AC
  Filled 2014-05-16: qty 30

## 2014-05-16 MED ORDER — PROPOFOL 10 MG/ML IV BOLUS
INTRAVENOUS | Status: AC
  Filled 2014-05-16: qty 20

## 2014-05-16 MED ORDER — NEOSTIGMINE METHYLSULFATE 10 MG/10ML IV SOLN
INTRAVENOUS | Status: AC
  Filled 2014-05-16: qty 1

## 2014-05-16 MED ORDER — FENTANYL CITRATE 0.05 MG/ML IJ SOLN
INTRAMUSCULAR | Status: DC | PRN
  Administered 2014-05-16 (×5): 50 ug via INTRAVENOUS

## 2014-05-16 MED ORDER — SODIUM CHLORIDE 0.9 % IR SOLN
Status: DC | PRN
  Administered 2014-05-16: 1000 mL

## 2014-05-16 MED ORDER — FENTANYL CITRATE 0.05 MG/ML IJ SOLN
INTRAMUSCULAR | Status: AC
  Administered 2014-05-16: 50 ug via INTRAVENOUS
  Filled 2014-05-16: qty 2

## 2014-05-16 MED ORDER — ROCURONIUM BROMIDE 100 MG/10ML IV SOLN
INTRAVENOUS | Status: DC | PRN
  Administered 2014-05-16: 40 mg via INTRAVENOUS

## 2014-05-16 MED ORDER — NEOSTIGMINE METHYLSULFATE 10 MG/10ML IV SOLN
INTRAVENOUS | Status: DC | PRN
  Administered 2014-05-16: 3 mg via INTRAVENOUS

## 2014-05-16 MED ORDER — BUPIVACAINE-EPINEPHRINE 0.25% -1:200000 IJ SOLN
INTRAMUSCULAR | Status: DC | PRN
  Administered 2014-05-16: 15 mL

## 2014-05-16 MED ORDER — 0.9 % SODIUM CHLORIDE (POUR BTL) OPTIME
TOPICAL | Status: DC | PRN
  Administered 2014-05-16: 1000 mL

## 2014-05-16 SURGICAL SUPPLY — 48 items
APL SKNCLS STERI-STRIP NONHPOA (GAUZE/BANDAGES/DRESSINGS) ×1
APPLIER CLIP 5 13 M/L LIGAMAX5 (MISCELLANEOUS) ×2
APR CLP MED LRG 5 ANG JAW (MISCELLANEOUS) ×1
BAG SPEC RTRVL LRG 6X4 10 (ENDOMECHANICALS) ×1
BENZOIN TINCTURE PRP APPL 2/3 (GAUZE/BANDAGES/DRESSINGS) ×2 IMPLANT
CANISTER SUCTION 2500CC (MISCELLANEOUS) ×2 IMPLANT
CHLORAPREP W/TINT 26ML (MISCELLANEOUS) ×2 IMPLANT
CLIP APPLIE 5 13 M/L LIGAMAX5 (MISCELLANEOUS) ×1 IMPLANT
CLOSURE STERI-STRIP 1/4X4 (GAUZE/BANDAGES/DRESSINGS) ×1 IMPLANT
COVER MAYO STAND STRL (DRAPES) ×2 IMPLANT
COVER SURGICAL LIGHT HANDLE (MISCELLANEOUS) ×2 IMPLANT
DECANTER SPIKE VIAL GLASS SM (MISCELLANEOUS) ×2 IMPLANT
DRAPE C-ARM 42X72 X-RAY (DRAPES) ×2 IMPLANT
DRAPE LAPAROSCOPIC ABDOMINAL (DRAPES) ×2 IMPLANT
DRAPE PROXIMA HALF (DRAPES) ×2 IMPLANT
DRAPE UTILITY 15X26 W/TAPE STR (DRAPE) ×4 IMPLANT
DRSG TEGADERM 2-3/8X2-3/4 SM (GAUZE/BANDAGES/DRESSINGS) ×8 IMPLANT
ELECT REM PT RETURN 9FT ADLT (ELECTROSURGICAL) ×2
ELECTRODE REM PT RTRN 9FT ADLT (ELECTROSURGICAL) ×1 IMPLANT
GAUZE SPONGE 2X2 8PLY STRL LF (GAUZE/BANDAGES/DRESSINGS) ×1 IMPLANT
GLOVE BIOGEL PI IND STRL 7.0 (GLOVE) IMPLANT
GLOVE BIOGEL PI IND STRL 8 (GLOVE) ×1 IMPLANT
GLOVE BIOGEL PI INDICATOR 7.0 (GLOVE) ×1
GLOVE BIOGEL PI INDICATOR 8 (GLOVE) ×1
GLOVE ECLIPSE 8.0 STRL XLNG CF (GLOVE) ×2 IMPLANT
GLOVE SURG SS PI 7.0 STRL IVOR (GLOVE) ×1 IMPLANT
GOWN STRL REUS W/ TWL LRG LVL3 (GOWN DISPOSABLE) ×4 IMPLANT
GOWN STRL REUS W/TWL LRG LVL3 (GOWN DISPOSABLE) ×8
HEMOSTAT SNOW SURGICEL 2X4 (HEMOSTASIS) IMPLANT
KIT BASIN OR (CUSTOM PROCEDURE TRAY) ×2 IMPLANT
KIT ROOM TURNOVER OR (KITS) ×2 IMPLANT
NS IRRIG 1000ML POUR BTL (IV SOLUTION) ×2 IMPLANT
PAD ARMBOARD 7.5X6 YLW CONV (MISCELLANEOUS) ×2 IMPLANT
POUCH SPECIMEN RETRIEVAL 10MM (ENDOMECHANICALS) ×2 IMPLANT
SCISSORS LAP 5X35 DISP (ENDOMECHANICALS) ×2 IMPLANT
SET CHOLANGIOGRAPH 5 50 .035 (SET/KITS/TRAYS/PACK) ×2 IMPLANT
SET IRRIG TUBING LAPAROSCOPIC (IRRIGATION / IRRIGATOR) ×2 IMPLANT
SLEEVE ENDOPATH XCEL 5M (ENDOMECHANICALS) ×4 IMPLANT
SPECIMEN JAR SMALL (MISCELLANEOUS) ×2 IMPLANT
SPONGE GAUZE 2X2 STER 10/PKG (GAUZE/BANDAGES/DRESSINGS) ×1
SUT MON AB 4-0 PC3 18 (SUTURE) ×2 IMPLANT
TOWEL OR 17X24 6PK STRL BLUE (TOWEL DISPOSABLE) ×2 IMPLANT
TOWEL OR 17X26 10 PK STRL BLUE (TOWEL DISPOSABLE) ×2 IMPLANT
TRAY LAPAROSCOPIC (CUSTOM PROCEDURE TRAY) ×2 IMPLANT
TROCAR XCEL BLUNT TIP 100MML (ENDOMECHANICALS) ×2 IMPLANT
TROCAR XCEL NON-BLD 11X100MML (ENDOMECHANICALS) IMPLANT
TROCAR XCEL NON-BLD 5MMX100MML (ENDOMECHANICALS) ×2 IMPLANT
TUBING INSUFFLATION (TUBING) ×2 IMPLANT

## 2014-05-16 NOTE — Transfer of Care (Signed)
Immediate Anesthesia Transfer of Care Note  Patient: Amber Sloan  Procedure(s) Performed: Procedure(s): LAPAROSCOPIC CHOLECYSTECTOMY WITH INTRAOPERATIVE CHOLANGIOGRAM (N/A)  Patient Location: PACU  Anesthesia Type:General  Level of Consciousness: awake, alert , oriented and patient cooperative  Airway & Oxygen Therapy: Patient Spontanous Breathing and Patient connected to nasal cannula oxygen  Post-op Assessment: Report given to PACU RN and Post -op Vital signs reviewed and stable  Post vital signs: Reviewed  Complications: No apparent anesthesia complications

## 2014-05-16 NOTE — Discharge Instructions (Addendum)
[facility] ______[facility] LAPAROSCOPIC SURGERY: POST OP INSTRUCTIONS Always review your discharge instruction sheet given to you by the facility where your surgery was performed. IF YOU HAVE DISABILITY OR FAMILY LEAVE FORMS, YOU MUST BRING THEM TO THE OFFICE FOR PROCESSING.   DO NOT GIVE THEM TO YOUR DOCTOR.  1. A prescription for pain medication may be given to you upon discharge.  Take your pain medication as prescribed, if needed.  If narcotic pain medicine is not needed, then you may take acetaminophen (Tylenol) or ibuprofen (Advil) as needed. 2. Take your usually prescribed medications unless otherwise directed. 3. If you need a refill on your pain medication, please contact your pharmacy.  They will contact our office to request authorization. Prescriptions will not be filled after 5pm or on week-ends. 4. You should follow a light diet the first few days after arrival home, such as soup and crackers, etc.  Be sure to include lots of fluids daily. 5. Most patients will experience some swelling and bruising in the area of the incisions.  Ice packs will help.  Swelling and bruising can take several days to resolve.  6. It is common to experience some constipation if taking pain medication after surgery.  Increasing fluid intake and taking a stool softener (such as Colace) will usually help or prevent this problem from occurring.  A mild laxative (Milk of Magnesia or Miralax) should be taken according to package instructions if there are no bowel movements after 48 hours. 7. Unless discharge instructions indicate otherwise, you may remove your bandages 72 hours after surgery, and you may shower at that time.  You may have steri-strips (small skin tapes) in place directly over the incision.  These strips should be left on the skin.  If your surgeon used skin glue on the incision, you may shower in 24 hours.  The glue will flake off over the next 2-3 weeks.  Any sutures or staples will be  removed at the office during your follow-up visit. 8. ACTIVITIES:  You may resume regular (light) daily activities beginning the next day--such as daily self-care, walking, climbing stairs--gradually increasing activities as tolerated.  You may have sexual intercourse when it is comfortable.  Refrain from any heavy lifting or straining-nothing over 10 pounds for 2 weeks. a. You may drive when you are no longer taking prescription pain medication, you can comfortably wear a seatbelt, and you can safely maneuver your car and apply brakes. b. RETURN TO WORK:  __________________________________________________________ 9. You should see your doctor in the office for a follow-up appointment approximately 2-3 weeks after your surgery.  Make sure that you call for this appointment within a day or two after you arrive home to insure a convenient appointment time. 10. OTHER INSTRUCTIONS: __________________________________________________________________________________________________________________________ __________________________________________________________________________________________________________________________ WHEN TO CALL YOUR DOCTOR: 1. Fever over 101.0 2. Inability to urinate 3. Continued bleeding from incision. 4. Increased pain, redness, or drainage from the incision. 5. Increasing abdominal pain  The clinic staff is available to answer your questions during regular business hours.  Please dont hesitate to call and ask to speak to one of the nurses for clinical concerns.  If you have a medical emergency, go to the nearest emergency room or call 911.  A surgeon from [facility] is always on call at the hospital. [address], [place], [place], [place]  [address] ? [address], [place], [place]   [address] [phone] ? [phone] ? FAX [phone] Web site: [email or web address]  General Anesthesia, Adult, Care After  Refer to this sheet in the  next few weeks. These instructions provide you with information on caring for yourself after your procedure. Your health care provider may also give you more specific instructions. Your treatment has been planned according to current medical practices, but problems sometimes occur. Call your health care provider if you have any problems or questions after your procedure.  WHAT TO EXPECT AFTER THE PROCEDURE  After the procedure, it is typical to experience:  Sleepiness.  Nausea and vomiting. HOME CARE INSTRUCTIONS  For the first 24 hours after general anesthesia:  Have a responsible person with you.  Do not drive a car. If you are alone, do not take public transportation.  Do not drink alcohol.  Do not take medicine that has not been prescribed by your health care provider.  Do not sign important papers or make important decisions.  You may resume a normal diet and activities as directed by your health care provider.  Change bandages (dressings) as directed.  If you have questions or problems that seem related to general anesthesia, call the hospital and ask for the anesthetist or anesthesiologist on call. SEEK MEDICAL CARE IF:  You have nausea and vomiting that continue the day after anesthesia.  You develop a rash. SEEK IMMEDIATE MEDICAL CARE IF:  You have difficulty breathing.  You have chest pain.  You have any allergic problems. Document Released: 09/20/2000 Document Revised: 02/14/2013 Document Reviewed: 12/28/2012  [facility] Patient Information 2014 [place], [place].

## 2014-05-16 NOTE — Anesthesia Preprocedure Evaluation (Addendum)
Anesthesia Evaluation  Patient identified by MRN, date of birth, ID band Patient awake    Reviewed: Allergy & Precautions, H&P , NPO status , Patient's Chart, lab work & pertinent test results, reviewed documented beta blocker date and time   History of Anesthesia Complications (+) PONV  Airway Mallampati: II   Neck ROM: Full    Dental  (+) Teeth Intact   Pulmonary sleep apnea and Continuous Positive Airway Pressure Ventilation ,  breath sounds clear to auscultation        Cardiovascular hypertension, Pt. on medications Dysrhythmias: reports palpitations without syncopy. Rhythm:Regular  ECHO EF 55%, Normal stress test both done 12/2013   Neuro/Psych    GI/Hepatic   Endo/Other    Renal/GU      Musculoskeletal   Abdominal (+) + obese,   Peds  Hematology   Anesthesia Other Findings   Reproductive/Obstetrics                           Anesthesia Physical Anesthesia Plan  ASA: III  Anesthesia Plan: General   Post-op Pain Management:    Induction: Intravenous  Airway Management Planned: Oral ETT  Additional Equipment:   Intra-op Plan:   Post-operative Plan: Extubation in OR  Informed Consent: I have reviewed the patients History and Physical, chart, labs and discussed the procedure including the risks, benefits and alternatives for the proposed anesthesia with the patient or authorized representative who has indicated his/her understanding and acceptance.     Plan Discussed with:   Anesthesia Plan Comments:         Anesthesia Quick Evaluation

## 2014-05-16 NOTE — Op Note (Signed)
Preoperative diagnosis:  Symptomatic cholelithiasis  Postoperative diagnosis:  Same    Procedure: Laparoscopic cholecystectomy with cholangiogram.  Surgeon: [name], M.D.  Asst.:  None  Anesthesia: General  Indication:   This is a 73 year old female with postprandial epigastric pain and gallstones. She now presents for elective cholecystectomy.  Technique: She was brought to the operating room, placed supine on the operating table, and a general anesthetic was administered.  The abdominal wall was then sterilely prepped and draped. Local anesthetic (Marcaine) was infiltrated in the subumbilical region. A small subumbilical incision was made through the skin, subcutaneous tissue, fascia, and peritoneum entering the peritoneal cavity under direct vision. A pursestring suture of 0 Vicryl was placed around the edges of the fascia. A Hassan trocar was introduced into the peritoneal cavity and a pneumoperitoneum was created by insufflation of carbon dioxide gas. The laparoscope was introduced into the trocar and no underlying bleeding or organ injury was noted. He/She was then placed in the reverse Trendelenburg position with the right side tilted slightly up.  Three 5 mm trocars were then placed into the abdominal cavity under laparoscopic vision. One in the epigastric area, and 2 in the right upper quadrant area. The gallbladder was visualized and adhesions between the gallbladder and omentum were divided.  Chronic inflammatory changes were noted.  The fundus was grasped and retracted toward the right shoulder.  The infundibulum was mobilized with dissection close to the gallbladder and retracted laterally. The cystic duct was identified and a window was created around it. The cystic artery branches were also identified. The critical view was achieved. A clip was placed at the neck of the gallbladder. A small incision was made in the cystic duct. A cholangiocatheter was introduced through the  anterior abdominal wall and placed in the cystic duct. A intraoperative cholangiogram was then performed.  Under real-time fluoroscopy, dilute contrast was injected into the cystic duct.  The common hepatic duct, the right and left hepatic ducts, and the common duct were all visualized.  Two filling defects were noted in the cystic duct. Contrast drained into the duodenum without obvious evidence of any obstructing CBD lesion. The final report is pending the Radiologist's interpretation.  The cholangiocatheter was removed.  Three stones were milked out of the cystic duct and removed from the abdominal cavity, Then, the cystic duct was clipped 3 times on the biliary side, and then the cystic duct was divided sharply. No bile leak was noted from the cystic duct stump.  The branches of the cystic artery were isolated and then clipped and divided. Following this the gallbladder was dissected free from the liver using electrocautery. The gallbladder was then placed in a retrieval bag and removed from the abdominal cavity through the subumbilical incision.  The gallbladder fossa was inspected, irrigated, and bleeding was controlled with electrocautery. Inspection showed that hemostasis was adequate and there was no evidence of bile leak.  The irrigation fluid was evacuated as much as possible.  The subumbilical trocar was removed and the fascial defect was closed by tightening and tying down the pursestring suture under laparoscopic vision.  The remaining trocars were removed and the pneumoperitoneum was released. The skin incisions were closed with 4-0 Monocryl subcuticular stitches. Steri-Strips and sterile dressings were applied.  The procedure was well-tolerated without any apparent complications. She was taken to the recovery room in satisfactory condition.

## 2014-05-16 NOTE — Addendum Note (Signed)
Addendum  created 05/16/14 1634 by Jenne Campus, CRNA   Modules edited: Anesthesia Medication Administration

## 2014-05-16 NOTE — Interval H&P Note (Signed)
History and Physical Interval Note:  05/16/2014 11:45 AM  Amber Sloan  has presented today for surgery, with the diagnosis of symptomatic cholelithiasis  The various methods of treatment have been discussed with the patient and family. After consideration of risks, benefits and other options for treatment, the patient has consented to  Procedure(s): LAPAROSCOPIC CHOLECYSTECTOMY WITH INTRAOPERATIVE CHOLANGIOGRAM (N/A) as a surgical intervention .  The patient's history has been reviewed, patient examined, no change in status, stable for surgery.  I have reviewed the patient's chart and labs.  Questions were answered to the patient's satisfaction.     Kale Rondeau Lenna Sciara

## 2014-05-16 NOTE — H&P (Signed)
Amber Sloan 04/16/2014 3:56 PM Location: [facility] Patient #: [id number] DOB: 1940/10/04 Married / Language: Amber Sloan / Race: White Female  History of Present Illness Odis Hollingshead MD; 04/16/2014 6:04 PM) Patient words: Discuss removal of gallbladder.  The patient is a 73 year old female   Note:She is referred by Dr. Oletta Lamas because of some upper abdominal pain and gallstones. She also has some lower abdominal pain. The upper abdominal pain is in the epigastric region. It is characterized at goal and sharp at times. It does radiate through to the back. Recently, she ate a fatty meal and the pain increased. The pain was also associated with nausea. Upper endoscopy demonstrated a small hiatal hernia but no ulcer disease or erosive esophagitis. Abdominal ultrasound demonstrates multiple small gallstones. Common bile duct diameter 6.7 mm. She's here because she is interested in cholecystectomy.   Other Problems [name], LPN; 31/51/7616 0:73 PM) Arthritis Back Pain Chest pain Cholelithiasis Diverticulosis High blood pressure Hypercholesterolemia Sleep Apnea  Past Surgical History [name], LPN; 71/11/2692 8:54 PM) Breast Biopsy Right. Knee Surgery Left. Spinal Surgery - Lower Back Tonsillectomy  Diagnostic Studies History [name], LPN; 62/70/3500 9:38 PM) Colonoscopy within last year Pap Smear >5 years ago  Allergies [name], LPN; 18/29/9371 6:96 PM) [name] [place] *MUSCULOSKELETAL THERAPY AGENTS* Sulfa Antibiotics  Medication History [name], LPN; 78/93/8101 7:51 PM) AmLODIPine Besylate (2.5MG  Tablet, Oral) Active. Benazepril-Hydrochlorothiazide (10-12.5MG  Tablet, Oral) Active. Etodolac (400MG  Tablet, Oral) Active. Raloxifene HCl (60MG  Tablet, Oral) Active. Simvastatin (20MG  Tablet, Oral) Active. Medications Reconciled  Social History [name], LPN; 02/58/5277 8:24  PM) Tobacco use Never smoker.  Family History [name], LPN; 23/53/6144 3:15 PM) Arthritis Mother.  Review of Systems [name] LPN; 40/01/6760 9:50 PM) General Present- Fatigue. Not Present- Appetite Loss, Chills, Fever, Night Sweats, Weight Gain and Weight Loss. Gastrointestinal Present- Bloating. Not Present- Abdominal Pain, Bloody Stool, Change in Bowel Habits, Chronic diarrhea, Constipation, Difficulty Swallowing, Excessive gas, Gets full quickly at meals, Hemorrhoids, Indigestion, Nausea, Rectal Pain and Vomiting. Musculoskeletal Present- Joint Pain. Not Present- Back Pain, Joint Stiffness, Muscle Pain, Muscle Weakness and Swelling of Extremities.   Vitals [name] LPN; 93/26/7124 5:80 PM) 04/16/2014 3:58 PM Weight: 172.38 lb Height: 64in Body Surface Area: 1.88 m Body Mass Index: 29.59 kg/m Temp.: 97.85F(Temporal)  Pulse: 80 (Regular)  Resp.: 18 (Unlabored)  BP: 118/62 (Sitting, Left Arm, Standard)    Physical Exam Odis Hollingshead MD; 04/16/2014 6:05 PM) The physical exam findings are as follows: Note:General: WDWN in NAD. Pleasant and cooperative.  HEENT: /AT, no facial masses  EYES: EOMI, no icterus  NECK: Supple, no obvious mass or thyroid enlargement.  CV: RRR, no murmur, no JVD.  CHEST: Breath sounds equal and clear. Respirations nonlabored.  ABDOMEN: Soft, nontender, nondistended, no masses, no organomegaly, no hernias.  MUSCULOSKELETAL: FROM, good muscle tone, no edema, no venous stasis changes  SKIN: No jaundice or suspicious rashes.  NEUROLOGIC: Alert and oriented, answers questions appropriately, normal gait and station.  PSYCHIATRIC: Normal mood, affect , and behavior.    Assessment & Plan Odis Hollingshead MD; 04/16/2014 6:06 PM) CALCULUS OF GALLBLADDER WITHOUT CHOLECYSTITIS WITHOUT OBSTRUCTION (574.20  K80.20) Impression: She does have biliary colic type symptoms after eating a fatty meal. We  discussed performing a laparoscopic cholecystectomy and one over the success rate. She is interested in proceeding with this.  Plan: Laparoscopic cholecystectomy with cholangiogram. I have explained the procedure, risks, and aftercare of cholecystectomy. Risks include but are  not limited to bleeding, infection, wound problems, anesthesia, diarrhea, bile leak, injury to common bile duct/liver/intestine. She seems to understand.  [name]

## 2014-05-16 NOTE — Anesthesia Procedure Notes (Signed)
Procedure Name: Intubation Date/Time: 05/16/2014 12:02 PM Performed by: Jenne Campus Pre-anesthesia Checklist: Patient identified, Emergency Drugs available, Suction available, Patient being monitored and Timeout performed Patient Re-evaluated:Patient Re-evaluated prior to inductionOxygen Delivery Method: Circle system utilized Preoxygenation: Pre-oxygenation with 100% oxygen Intubation Type: IV induction Ventilation: Mask ventilation without difficulty and Oral airway inserted - appropriate to patient size Laryngoscope Size: Miller and 2 Grade View: Grade I Tube size: 7.0 mm Number of attempts: 1 Airway Equipment and Method: Stylet Placement Confirmation: ETT inserted through vocal cords under direct vision,  positive ETCO2,  CO2 detector and breath sounds checked- equal and bilateral Secured at: 20 cm Tube secured with: Tape Dental Injury: Teeth and Oropharynx as per pre-operative assessment

## 2014-05-16 NOTE — Anesthesia Postprocedure Evaluation (Signed)
  Anesthesia Post-op Note  Patient: Amber Sloan  Procedure(s) Performed: Procedure(s): LAPAROSCOPIC CHOLECYSTECTOMY WITH INTRAOPERATIVE CHOLANGIOGRAM (N/A)  Patient Location: PACU  Anesthesia Type:General  Level of Consciousness: awake and alert   Airway and Oxygen Therapy: Patient Spontanous Breathing and Patient connected to nasal cannula oxygen  Post-op Pain: mild  Post-op Assessment: Post-op Vital signs reviewed, Respiratory Function Stable, Patent Airway and No signs of Nausea or vomiting  Post-op Vital Signs: Reviewed and stable  Last Vitals:  Filed Vitals:   05/16/14 1343  BP: 111/49  Pulse: 88  Temp:   Resp: 13    Complications: No apparent anesthesia complications

## 2014-05-17 ENCOUNTER — Encounter (HOSPITAL_COMMUNITY): Payer: Self-pay | Admitting: General Surgery

## 2014-06-19 ENCOUNTER — Other Ambulatory Visit (HOSPITAL_COMMUNITY): Payer: Self-pay | Admitting: Family Medicine

## 2014-06-19 DIAGNOSIS — Z1231 Encounter for screening mammogram for malignant neoplasm of breast: Secondary | ICD-10-CM

## 2014-07-18 ENCOUNTER — Telehealth: Payer: Self-pay | Admitting: Cardiovascular Disease

## 2014-07-18 MED ORDER — AMLODIPINE BESYLATE 2.5 MG PO TABS: 2.5000 mg | ORAL_TABLET | Freq: Every day | ORAL | Status: DC

## 2014-07-18 NOTE — Telephone Encounter (Signed)
Rx Refilled  

## 2014-07-18 NOTE — Telephone Encounter (Signed)
Pt insurance have changed,she need a new prescription. Please call her Amlodipine 2.5 mg #90 and refills to [facility] [phone].

## 2014-07-22 ENCOUNTER — Ambulatory Visit (HOSPITAL_COMMUNITY)
Admission: RE | Admit: 2014-07-22 | Discharge: 2014-07-22 | Disposition: A | Discharge status: Home / Self Care | Payer: PPO | Source: Ambulatory Visit | Attending: Family Medicine | Admitting: Family Medicine

## 2014-07-22 ENCOUNTER — Other Ambulatory Visit (HOSPITAL_COMMUNITY): Payer: Self-pay | Admitting: Family Medicine

## 2014-07-22 DIAGNOSIS — Z1231 Encounter for screening mammogram for malignant neoplasm of breast: Secondary | ICD-10-CM

## 2014-11-06 ENCOUNTER — Telehealth: Payer: Self-pay | Admitting: Cardiovascular Disease

## 2014-11-06 MED ORDER — AMLODIPINE BESYLATE 2.5 MG PO TABS: 2.5000 mg | ORAL_TABLET | Freq: Every day | ORAL | Status: DC

## 2014-11-06 NOTE — Telephone Encounter (Signed)
Wants to know if a new prescription can be sent to [facility] for Amlodipine Beslate 2.5mg   For 90 days versus a 30 day prescription. Her insurance is a little cheaper that way .Marland Kitchen Please call if you have any questions   Thanks

## 2014-11-06 NOTE — Telephone Encounter (Signed)
Refill submitted to patient's preferred pharmacy.  

## 2014-12-23 ENCOUNTER — Other Ambulatory Visit: Payer: Self-pay

## 2015-01-10 ENCOUNTER — Ambulatory Visit (INDEPENDENT_AMBULATORY_CARE_PROVIDER_SITE_OTHER): Payer: PPO | Admitting: Cardiovascular Disease

## 2015-01-10 ENCOUNTER — Encounter: Payer: Self-pay | Admitting: Cardiovascular Disease

## 2015-01-10 VITALS — BP 110/68 | HR 60 | Ht 63.5 in | Wt 176.1 lb

## 2015-01-10 DIAGNOSIS — I10 Essential (primary) hypertension: Secondary | ICD-10-CM | POA: Diagnosis not present

## 2015-01-10 DIAGNOSIS — R079 Chest pain, unspecified: Secondary | ICD-10-CM

## 2015-01-10 DIAGNOSIS — E785 Hyperlipidemia, unspecified: Secondary | ICD-10-CM | POA: Diagnosis not present

## 2015-01-10 NOTE — Assessment & Plan Note (Signed)
History of chest pain remotely with a negative stress test and 2-D echo. The pain has ultimately resolved. She did ultimately end up having a cholecystectomy in November of last year.

## 2015-01-10 NOTE — Progress Notes (Signed)
01/10/2015 [name]   Dec 09, 1940  [id number]  Primary Physician Osborne Casco, MD Primary Cardiologist: Lorretta Harp MD Amber Sloan   HPI:  Ms. 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 She was referred by Dr. Kelton Pillar for cardiovascular evaluation because of new-onset angina. I last saw her in the office 11/20/13. Her cardiac risk factors include treated hypertension, and hyperlipidemia. She has a strong family history of heart disease with both parents had myocardial infarctions, brother who died at age 84 of a myocardial infarction at 46 other brothers who have CAD. When I saw her a year ago she had had new onset chest pain. A 2-D echo Myoview stress test were normal after that. In November of last year she underwent cholecystectomy and her symptoms completely resolved.  Current Outpatient Prescriptions  Medication Sig Dispense Refill  . acetaminophen (TYLENOL ARTHRITIS PAIN) 650 MG CR tablet Take 1,300 mg by mouth at bedtime.    Marland Kitchen amLODipine (NORVASC) 2.5 MG tablet Take 1 tablet (2.5 mg total) by mouth daily. NEED APPT FOR FURTHER REFILLS 90 tablet 0  . benazepril-hydrochlorthiazide (LOTENSIN HCT) 10-12.5 MG per tablet Take 1 tablet by mouth every morning.    . Flaxseed, Linseed, (FLAX SEEDS PO) Take 1,200 mg by mouth daily.    Marland Kitchen GARLIC OIL PO Take 1 capsule by mouth daily.    Marland Kitchen GLUCOSAMINE HCL PO Take 1 tablet by mouth daily.    Marland Kitchen loratadine-pseudoephedrine (CLARITIN-D 24-HOUR) 10-240 MG per 24 hr tablet Take 1 tablet by mouth as needed for allergies.    . Magnesium 500 MG CAPS Take 1 capsule by mouth daily.    . Multiple Vitamin (MULTIVITAMIN WITH MINERALS) TABS tablet Take 1 tablet by mouth daily.    . polyethylene glycol (MIRALAX / GLYCOLAX) packet Take 17 g by mouth daily.    . raloxifene (EVISTA) 60 MG tablet Take 60 mg by mouth  daily.    . simvastatin (ZOCOR) 20 MG tablet Take 20 mg by mouth daily.     No current facility-administered medications for this visit.    Allergies  Allergen Reactions  . Parafon Forte Dsc [Chlorzoxazone] Other (See Comments)    Old allergy unknown reaction.   . Sulfa Antibiotics Nausea And Vomiting    Drugs    History   Social History  . Marital Status: Married    Spouse Name: N/A  . Number of Children: N/A  . Years of Education: N/A   Occupational History  . Not on file.   Social History Main Topics  . Smoking status: Never Smoker   . Smokeless tobacco: Not on file  . Alcohol Use: No  . Drug Use: No  . Sexual Activity: Not on file   Other Topics Concern  . Not on file   Social History Narrative     Review of Systems: General: negative for chills, fever, night sweats or weight changes.  Cardiovascular: negative for chest pain, dyspnea on exertion, edema, orthopnea, palpitations, paroxysmal nocturnal dyspnea or shortness of breath Dermatological: negative for rash Respiratory: negative for cough or wheezing Urologic: negative for hematuria Abdominal: negative for nausea, vomiting, diarrhea, bright red blood per rectum, melena, or hematemesis Neurologic: negative for visual changes, syncope, or dizziness All other systems reviewed and are otherwise negative except as noted above.    Blood pressure 110/68, pulse 60, height 5' 3.5" (1.613 m), weight 176 lb  1.6 oz (79.878 kg).  General appearance: alert and no distress Neck: no adenopathy, no carotid bruit, no JVD, supple, symmetrical, trachea midline and thyroid not enlarged, symmetric, no tenderness/mass/nodules Lungs: clear to auscultation bilaterally Heart: regular rate and rhythm, S1, S2 normal, no murmur, click, rub or gallop Extremities: extremities normal, atraumatic, no cyanosis or edema  EKG normal sinus rhythm at 60 with ST or T-wave changes. I personally reviewed this EKG  ASSESSMENT AND PLAN:    Hyperlipidemia History of hyperlipidemia on simvastatin 20 followed by her PCP  Essential hypertension History of hypertension blood pressure measured at 110/68. She is on Lotensin, hydrochlorothiazide and Norvasc. Continue current meds at current dosing  Chest pain History of chest pain remotely with a negative stress test and 2-D echo. The pain has ultimately resolved. She did ultimately end up having a cholecystectomy in November of last year.      Lorretta Harp MD FACP,FACC,FAHA, [facility] 01/10/2015 10:24 AM

## 2015-01-10 NOTE — Patient Instructions (Signed)
Follow up with Dr Berry as needed.  

## 2015-01-10 NOTE — Assessment & Plan Note (Signed)
History of hypertension blood pressure measured at 110/68. She is on Lotensin, hydrochlorothiazide and Norvasc. Continue current meds at current dosing

## 2015-01-10 NOTE — Assessment & Plan Note (Signed)
History of hyperlipidemia on simvastatin 20 followed by her PCP

## 2015-02-11 ENCOUNTER — Other Ambulatory Visit: Payer: Self-pay | Admitting: Cardiovascular Disease

## 2015-02-11 NOTE — Telephone Encounter (Signed)
Rx(s) sent to pharmacy electronically.  

## 2015-07-01 ENCOUNTER — Other Ambulatory Visit: Payer: Self-pay

## 2015-07-01 DIAGNOSIS — Z1231 Encounter for screening mammogram for malignant neoplasm of breast: Secondary | ICD-10-CM

## 2015-07-25 ENCOUNTER — Ambulatory Visit
Admission: RE | Admit: 2015-07-25 | Discharge: 2015-07-25 | Disposition: A | Discharge status: Home / Self Care | Payer: PPO | Source: Ambulatory Visit

## 2015-07-25 DIAGNOSIS — Z1231 Encounter for screening mammogram for malignant neoplasm of breast: Secondary | ICD-10-CM | POA: Diagnosis not present

## 2015-08-04 DIAGNOSIS — Z961 Presence of intraocular lens: Secondary | ICD-10-CM | POA: Diagnosis not present

## 2015-11-18 DIAGNOSIS — M8588 Other specified disorders of bone density and structure, other site: Secondary | ICD-10-CM | POA: Diagnosis not present

## 2015-11-18 DIAGNOSIS — M150 Primary generalized (osteo)arthritis: Secondary | ICD-10-CM | POA: Diagnosis not present

## 2015-11-18 DIAGNOSIS — E7800 Pure hypercholesterolemia, unspecified: Secondary | ICD-10-CM | POA: Diagnosis not present

## 2015-11-18 DIAGNOSIS — G4733 Obstructive sleep apnea (adult) (pediatric): Secondary | ICD-10-CM | POA: Diagnosis not present

## 2015-11-18 DIAGNOSIS — I10 Essential (primary) hypertension: Secondary | ICD-10-CM | POA: Diagnosis not present

## 2015-11-18 DIAGNOSIS — Z0000 Encounter for general adult medical examination without abnormal findings: Secondary | ICD-10-CM | POA: Diagnosis not present

## 2015-11-19 DIAGNOSIS — G4733 Obstructive sleep apnea (adult) (pediatric): Secondary | ICD-10-CM | POA: Diagnosis not present

## 2015-12-29 DIAGNOSIS — M8588 Other specified disorders of bone density and structure, other site: Secondary | ICD-10-CM | POA: Diagnosis not present

## 2016-01-26 ENCOUNTER — Other Ambulatory Visit: Payer: Self-pay | Admitting: Cardiovascular Disease

## 2016-01-26 NOTE — Telephone Encounter (Signed)
REFILL 

## 2016-02-19 ENCOUNTER — Other Ambulatory Visit: Payer: Self-pay | Admitting: Physician Assistant

## 2016-02-19 ENCOUNTER — Ambulatory Visit
Admission: RE | Admit: 2016-02-19 | Discharge: 2016-02-19 | Disposition: A | Discharge status: Home / Self Care | Payer: PPO | Source: Ambulatory Visit | Attending: Physician Assistant | Admitting: Physician Assistant

## 2016-02-19 DIAGNOSIS — R05 Cough: Secondary | ICD-10-CM

## 2016-02-19 DIAGNOSIS — R0602 Shortness of breath: Secondary | ICD-10-CM | POA: Diagnosis not present

## 2016-02-19 DIAGNOSIS — R059 Cough, unspecified: Secondary | ICD-10-CM

## 2016-03-24 DIAGNOSIS — D1800 Hemangioma unspecified site: Secondary | ICD-10-CM | POA: Diagnosis not present

## 2016-03-24 DIAGNOSIS — L570 Actinic keratosis: Secondary | ICD-10-CM | POA: Diagnosis not present

## 2016-03-24 DIAGNOSIS — Z23 Encounter for immunization: Secondary | ICD-10-CM | POA: Diagnosis not present

## 2016-03-24 DIAGNOSIS — L821 Other seborrheic keratosis: Secondary | ICD-10-CM | POA: Diagnosis not present

## 2016-03-24 DIAGNOSIS — D225 Melanocytic nevi of trunk: Secondary | ICD-10-CM | POA: Diagnosis not present

## 2016-03-24 DIAGNOSIS — Z85828 Personal history of other malignant neoplasm of skin: Secondary | ICD-10-CM | POA: Diagnosis not present

## 2016-03-24 DIAGNOSIS — L814 Other melanin hyperpigmentation: Secondary | ICD-10-CM | POA: Diagnosis not present

## 2016-04-13 DIAGNOSIS — S0501XA Injury of conjunctiva and corneal abrasion without foreign body, right eye, initial encounter: Secondary | ICD-10-CM | POA: Diagnosis not present

## 2016-04-14 DIAGNOSIS — S0501XD Injury of conjunctiva and corneal abrasion without foreign body, right eye, subsequent encounter: Secondary | ICD-10-CM | POA: Diagnosis not present

## 2016-05-04 ENCOUNTER — Other Ambulatory Visit: Payer: Self-pay | Admitting: Cardiovascular Disease

## 2016-05-04 NOTE — Telephone Encounter (Signed)
Rx(s) sent to pharmacy electronically.  

## 2016-05-27 ENCOUNTER — Other Ambulatory Visit: Payer: Self-pay | Admitting: Family Medicine

## 2016-05-27 DIAGNOSIS — I10 Essential (primary) hypertension: Secondary | ICD-10-CM | POA: Diagnosis not present

## 2016-05-27 DIAGNOSIS — M5412 Radiculopathy, cervical region: Secondary | ICD-10-CM

## 2016-05-27 DIAGNOSIS — E7800 Pure hypercholesterolemia, unspecified: Secondary | ICD-10-CM | POA: Diagnosis not present

## 2016-06-01 ENCOUNTER — Ambulatory Visit (INDEPENDENT_AMBULATORY_CARE_PROVIDER_SITE_OTHER): Payer: PPO | Admitting: Cardiovascular Disease

## 2016-06-01 ENCOUNTER — Encounter: Payer: Self-pay | Admitting: Cardiovascular Disease

## 2016-06-01 VITALS — BP 134/72 | HR 68 | Ht 63.0 in | Wt 173.0 lb

## 2016-06-01 DIAGNOSIS — I10 Essential (primary) hypertension: Secondary | ICD-10-CM | POA: Diagnosis not present

## 2016-06-01 DIAGNOSIS — E7800 Pure hypercholesterolemia, unspecified: Secondary | ICD-10-CM | POA: Diagnosis not present

## 2016-06-01 DIAGNOSIS — I208 Other forms of angina pectoris: Secondary | ICD-10-CM

## 2016-06-01 NOTE — Assessment & Plan Note (Signed)
History of hypertension blood pressure measured 134/72. She is on amlodipine, Lotensin and hydrochlorothiazide. Continue current meds at current dosing

## 2016-06-01 NOTE — Progress Notes (Signed)
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 She was referred by Dr. Kelton Pillar for cardiovascular evaluation because of new-onset angina. I last saw her in the office 01/10/15. Her cardiac risk factors include treated hypertension, and hyperlipidemia. She has a strong family history of heart disease with both parents had myocardial infarctions, brother who died at age 89 of a myocardial infarction at 71 other brothers who have CAD. When I saw her a year ago she had had new onset chest pain. A 2-D echo Myoview stress test were normal after that. In November of last year she underwent cholecystectomy and her symptoms completely resolved. Since I saw her a year ago she's been completely symptomatic.   Current Outpatient Prescriptions  Medication Sig Dispense Refill  . acetaminophen (TYLENOL ARTHRITIS PAIN) 650 MG CR tablet Take 1,300 mg by mouth at bedtime.    Marland Kitchen amLODipine (NORVASC) 2.5 MG tablet TAKE 1 TABLET BY MOUTH DAILY 90 tablet 1  . benazepril-hydrochlorthiazide (LOTENSIN HCT) 10-12.5 MG per tablet Take 1 tablet by mouth every morning.    . Flaxseed, Linseed, (FLAX SEEDS PO) Take 1,200 mg by mouth daily.    Marland Kitchen GARLIC OIL PO Take 1 capsule by mouth daily.    Marland Kitchen GLUCOSAMINE HCL PO Take 1 tablet by mouth daily.    Marland Kitchen loratadine-pseudoephedrine (CLARITIN-D 24-HOUR) 10-240 MG per 24 hr tablet Take 1 tablet by mouth as needed for allergies.    . Magnesium 500 MG CAPS Take 1 capsule by mouth daily.    . Multiple Vitamin (MULTIVITAMIN WITH MINERALS) TABS tablet Take 1 tablet by mouth daily.    . polyethylene glycol (MIRALAX / GLYCOLAX) packet Take 17 g by mouth daily.    . raloxifene (EVISTA) 60 MG tablet  Take 60 mg by mouth daily.    . simvastatin (ZOCOR) 20 MG tablet Take 20 mg by mouth daily.     No current facility-administered medications for this visit.     Allergies  Allergen Reactions  . Parafon Forte Dsc [Chlorzoxazone] Other (See Comments)    Old allergy unknown reaction.   . Sulfa Antibiotics Nausea And Vomiting    Drugs    Social History   Social History  . Marital status: Married    Spouse name: N/A  . Number of children: N/A  . Years of education: N/A   Occupational History  . Not on file.   Social History Main Topics  . Smoking status: Never Smoker  . Smokeless tobacco: Never Used  . Alcohol use No  . Drug use: No  . Sexual activity: Not on file   Other Topics Concern  . Not on file   Social History Narrative  . No narrative on file     Review of Systems: General: negative for chills, fever, night sweats or weight changes.  Cardiovascular: negative for chest pain, dyspnea on exertion, edema, orthopnea, palpitations, paroxysmal nocturnal dyspnea or shortness of breath Dermatological: negative for rash Respiratory: negative for cough or wheezing Urologic: negative for hematuria Abdominal: negative for nausea, vomiting, diarrhea, bright red blood per rectum, melena, or hematemesis Neurologic: negative for visual changes, syncope, or dizziness All other systems reviewed and are otherwise negative except as noted above.    Blood  pressure 134/72, pulse 68, height 5\' 3"  (1.6 m), weight 173 lb (78.5 kg).  General appearance: alert and no distress Neck: no adenopathy, no carotid bruit, no JVD, supple, symmetrical, trachea midline and thyroid not enlarged, symmetric, no tenderness/mass/nodules Lungs: clear to auscultation bilaterally Heart: regular rate and rhythm, S1, S2 normal, no murmur, click, rub or gallop Extremities: extremities normal, atraumatic, no cyanosis or edema  EKG sinus rhythm at 68 with septal Q waves. Per surgery of this  EKG  ASSESSMENT AND PLAN:   Chest pain History of chest pain in the past with a normal 2-D echo Myoview stress test. Her pain subsequently resolved after she had a cholecystectomy suggesting that this was from her gallbladder.  Essential hypertension History of hypertension blood pressure measured 134/72. She is on amlodipine, Lotensin and hydrochlorothiazide. Continue current meds at current dosing  Hyperlipidemia History of hyperlipidemia on statin therapy followed by her PCP      Lorretta Harp MD [facility], [place] 06/01/2016 12:00 PM

## 2016-06-01 NOTE — Assessment & Plan Note (Signed)
History of hyperlipidemia on statin therapy followed by her PCP. 

## 2016-06-01 NOTE — Patient Instructions (Signed)
Medication Instructions: Your physician recommends that you continue on your current medications as directed. Please refer to the Current Medication list given to you today.   Follow-Up: Your physician recommends that you  follow-up as needed with Dr. Gwenlyn Found.  If you need a refill on your cardiac medications before your next appointment, please call your pharmacy.

## 2016-06-01 NOTE — Assessment & Plan Note (Signed)
History of chest pain in the past with a normal 2-D echo Myoview stress test. Her pain subsequently resolved after she had a cholecystectomy suggesting that this was from her gallbladder.

## 2016-06-14 ENCOUNTER — Ambulatory Visit
Admission: RE | Admit: 2016-06-14 | Discharge: 2016-06-14 | Disposition: A | Discharge status: Home / Self Care | Payer: PPO | Source: Ambulatory Visit | Attending: Family Medicine | Admitting: Family Medicine

## 2016-06-14 DIAGNOSIS — M5412 Radiculopathy, cervical region: Secondary | ICD-10-CM

## 2016-06-14 DIAGNOSIS — M4802 Spinal stenosis, cervical region: Secondary | ICD-10-CM | POA: Diagnosis not present

## 2016-06-25 DIAGNOSIS — M533 Sacrococcygeal disorders, not elsewhere classified: Secondary | ICD-10-CM | POA: Diagnosis not present

## 2016-06-29 ENCOUNTER — Other Ambulatory Visit: Payer: Self-pay | Admitting: Family Medicine

## 2016-06-29 DIAGNOSIS — Z1231 Encounter for screening mammogram for malignant neoplasm of breast: Secondary | ICD-10-CM

## 2016-07-07 DIAGNOSIS — M533 Sacrococcygeal disorders, not elsewhere classified: Secondary | ICD-10-CM | POA: Diagnosis not present

## 2016-07-12 DIAGNOSIS — M533 Sacrococcygeal disorders, not elsewhere classified: Secondary | ICD-10-CM | POA: Diagnosis not present

## 2016-07-12 DIAGNOSIS — M545 Low back pain: Secondary | ICD-10-CM | POA: Diagnosis not present

## 2016-07-16 DIAGNOSIS — M533 Sacrococcygeal disorders, not elsewhere classified: Secondary | ICD-10-CM | POA: Diagnosis not present

## 2016-07-16 DIAGNOSIS — M545 Low back pain: Secondary | ICD-10-CM | POA: Diagnosis not present

## 2016-07-20 DIAGNOSIS — M533 Sacrococcygeal disorders, not elsewhere classified: Secondary | ICD-10-CM | POA: Diagnosis not present

## 2016-07-20 DIAGNOSIS — M545 Low back pain: Secondary | ICD-10-CM | POA: Diagnosis not present

## 2016-07-22 DIAGNOSIS — M533 Sacrococcygeal disorders, not elsewhere classified: Secondary | ICD-10-CM | POA: Diagnosis not present

## 2016-07-26 DIAGNOSIS — M545 Low back pain: Secondary | ICD-10-CM | POA: Diagnosis not present

## 2016-07-26 DIAGNOSIS — M533 Sacrococcygeal disorders, not elsewhere classified: Secondary | ICD-10-CM | POA: Diagnosis not present

## 2016-07-29 ENCOUNTER — Ambulatory Visit
Admission: RE | Admit: 2016-07-29 | Discharge: 2016-07-29 | Disposition: A | Discharge status: Home / Self Care | Payer: PPO | Source: Ambulatory Visit | Attending: Family Medicine | Admitting: Family Medicine

## 2016-07-29 DIAGNOSIS — Z1231 Encounter for screening mammogram for malignant neoplasm of breast: Secondary | ICD-10-CM

## 2016-07-30 DIAGNOSIS — M533 Sacrococcygeal disorders, not elsewhere classified: Secondary | ICD-10-CM | POA: Diagnosis not present

## 2016-07-30 DIAGNOSIS — M545 Low back pain: Secondary | ICD-10-CM | POA: Diagnosis not present

## 2016-08-03 DIAGNOSIS — M533 Sacrococcygeal disorders, not elsewhere classified: Secondary | ICD-10-CM | POA: Diagnosis not present

## 2016-08-03 DIAGNOSIS — M545 Low back pain: Secondary | ICD-10-CM | POA: Diagnosis not present

## 2016-08-05 DIAGNOSIS — Z961 Presence of intraocular lens: Secondary | ICD-10-CM | POA: Diagnosis not present

## 2016-08-06 DIAGNOSIS — M533 Sacrococcygeal disorders, not elsewhere classified: Secondary | ICD-10-CM | POA: Diagnosis not present

## 2016-08-06 DIAGNOSIS — M545 Low back pain: Secondary | ICD-10-CM | POA: Diagnosis not present

## 2016-08-11 DIAGNOSIS — M533 Sacrococcygeal disorders, not elsewhere classified: Secondary | ICD-10-CM | POA: Diagnosis not present

## 2016-08-11 DIAGNOSIS — M545 Low back pain: Secondary | ICD-10-CM | POA: Diagnosis not present

## 2016-08-11 DIAGNOSIS — M5416 Radiculopathy, lumbar region: Secondary | ICD-10-CM | POA: Diagnosis not present

## 2016-08-20 DIAGNOSIS — M25512 Pain in left shoulder: Secondary | ICD-10-CM | POA: Diagnosis not present

## 2016-08-25 DIAGNOSIS — M5416 Radiculopathy, lumbar region: Secondary | ICD-10-CM | POA: Diagnosis not present

## 2016-09-16 DIAGNOSIS — G4733 Obstructive sleep apnea (adult) (pediatric): Secondary | ICD-10-CM | POA: Diagnosis not present

## 2016-10-20 DIAGNOSIS — M5416 Radiculopathy, lumbar region: Secondary | ICD-10-CM | POA: Diagnosis not present

## 2016-11-02 DIAGNOSIS — M5416 Radiculopathy, lumbar region: Secondary | ICD-10-CM | POA: Diagnosis not present

## 2016-11-09 ENCOUNTER — Other Ambulatory Visit: Payer: Self-pay | Admitting: Cardiovascular Disease

## 2016-11-17 DIAGNOSIS — L570 Actinic keratosis: Secondary | ICD-10-CM | POA: Diagnosis not present

## 2016-11-17 DIAGNOSIS — M47816 Spondylosis without myelopathy or radiculopathy, lumbar region: Secondary | ICD-10-CM | POA: Diagnosis not present

## 2016-11-30 DIAGNOSIS — M47816 Spondylosis without myelopathy or radiculopathy, lumbar region: Secondary | ICD-10-CM | POA: Diagnosis not present

## 2016-12-23 DIAGNOSIS — M150 Primary generalized (osteo)arthritis: Secondary | ICD-10-CM | POA: Diagnosis not present

## 2016-12-23 DIAGNOSIS — E7800 Pure hypercholesterolemia, unspecified: Secondary | ICD-10-CM | POA: Diagnosis not present

## 2016-12-23 DIAGNOSIS — M8588 Other specified disorders of bone density and structure, other site: Secondary | ICD-10-CM | POA: Diagnosis not present

## 2016-12-23 DIAGNOSIS — G4733 Obstructive sleep apnea (adult) (pediatric): Secondary | ICD-10-CM | POA: Diagnosis not present

## 2016-12-23 DIAGNOSIS — I10 Essential (primary) hypertension: Secondary | ICD-10-CM | POA: Diagnosis not present

## 2016-12-23 DIAGNOSIS — Z1389 Encounter for screening for other disorder: Secondary | ICD-10-CM | POA: Diagnosis not present

## 2016-12-23 DIAGNOSIS — Z124 Encounter for screening for malignant neoplasm of cervix: Secondary | ICD-10-CM | POA: Diagnosis not present

## 2016-12-23 DIAGNOSIS — R21 Rash and other nonspecific skin eruption: Secondary | ICD-10-CM | POA: Diagnosis not present

## 2016-12-23 DIAGNOSIS — Z01419 Encounter for gynecological examination (general) (routine) without abnormal findings: Secondary | ICD-10-CM | POA: Diagnosis not present

## 2016-12-23 DIAGNOSIS — Z0000 Encounter for general adult medical examination without abnormal findings: Secondary | ICD-10-CM | POA: Diagnosis not present

## 2017-01-07 DIAGNOSIS — M47816 Spondylosis without myelopathy or radiculopathy, lumbar region: Secondary | ICD-10-CM | POA: Diagnosis not present

## 2017-02-01 DIAGNOSIS — Z1211 Encounter for screening for malignant neoplasm of colon: Secondary | ICD-10-CM | POA: Diagnosis not present

## 2017-03-29 DIAGNOSIS — Z23 Encounter for immunization: Secondary | ICD-10-CM | POA: Diagnosis not present

## 2017-03-29 DIAGNOSIS — M47816 Spondylosis without myelopathy or radiculopathy, lumbar region: Secondary | ICD-10-CM | POA: Diagnosis not present

## 2017-03-29 DIAGNOSIS — L570 Actinic keratosis: Secondary | ICD-10-CM | POA: Diagnosis not present

## 2017-03-29 DIAGNOSIS — D1800 Hemangioma unspecified site: Secondary | ICD-10-CM | POA: Diagnosis not present

## 2017-03-29 DIAGNOSIS — L821 Other seborrheic keratosis: Secondary | ICD-10-CM | POA: Diagnosis not present

## 2017-03-29 DIAGNOSIS — Z85828 Personal history of other malignant neoplasm of skin: Secondary | ICD-10-CM | POA: Diagnosis not present

## 2017-03-29 DIAGNOSIS — L814 Other melanin hyperpigmentation: Secondary | ICD-10-CM | POA: Diagnosis not present

## 2017-03-29 DIAGNOSIS — D225 Melanocytic nevi of trunk: Secondary | ICD-10-CM | POA: Diagnosis not present

## 2017-04-01 DIAGNOSIS — G4733 Obstructive sleep apnea (adult) (pediatric): Secondary | ICD-10-CM | POA: Diagnosis not present

## 2017-05-06 ENCOUNTER — Other Ambulatory Visit: Payer: Self-pay | Admitting: Cardiovascular Disease

## 2017-06-06 ENCOUNTER — Other Ambulatory Visit: Payer: Self-pay | Admitting: Cardiovascular Disease

## 2017-06-14 DIAGNOSIS — L570 Actinic keratosis: Secondary | ICD-10-CM | POA: Diagnosis not present

## 2017-07-05 ENCOUNTER — Other Ambulatory Visit: Payer: Self-pay | Admitting: Family Medicine

## 2017-07-05 DIAGNOSIS — Z1231 Encounter for screening mammogram for malignant neoplasm of breast: Secondary | ICD-10-CM

## 2017-07-06 ENCOUNTER — Other Ambulatory Visit: Payer: Self-pay | Admitting: Cardiovascular Disease

## 2017-07-06 DIAGNOSIS — E7800 Pure hypercholesterolemia, unspecified: Secondary | ICD-10-CM | POA: Diagnosis not present

## 2017-07-06 DIAGNOSIS — I10 Essential (primary) hypertension: Secondary | ICD-10-CM | POA: Diagnosis not present

## 2017-08-03 ENCOUNTER — Ambulatory Visit
Admission: RE | Admit: 2017-08-03 | Discharge: 2017-08-03 | Disposition: A | Discharge status: Home / Self Care | Payer: PPO | Source: Ambulatory Visit | Attending: Family Medicine | Admitting: Family Medicine

## 2017-08-03 DIAGNOSIS — Z1231 Encounter for screening mammogram for malignant neoplasm of breast: Secondary | ICD-10-CM | POA: Diagnosis not present

## 2017-08-05 DIAGNOSIS — G4733 Obstructive sleep apnea (adult) (pediatric): Secondary | ICD-10-CM | POA: Diagnosis not present

## 2017-08-08 ENCOUNTER — Other Ambulatory Visit: Payer: Self-pay | Admitting: Cardiovascular Disease

## 2017-08-09 DIAGNOSIS — L570 Actinic keratosis: Secondary | ICD-10-CM | POA: Diagnosis not present

## 2017-08-12 DIAGNOSIS — H524 Presbyopia: Secondary | ICD-10-CM | POA: Diagnosis not present

## 2017-09-05 ENCOUNTER — Other Ambulatory Visit: Payer: Self-pay | Admitting: Cardiovascular Disease

## 2017-10-04 DIAGNOSIS — L570 Actinic keratosis: Secondary | ICD-10-CM | POA: Diagnosis not present

## 2017-10-04 DIAGNOSIS — L719 Rosacea, unspecified: Secondary | ICD-10-CM | POA: Diagnosis not present

## 2017-10-07 ENCOUNTER — Other Ambulatory Visit: Payer: Self-pay | Admitting: Cardiovascular Disease

## 2017-10-11 ENCOUNTER — Telehealth: Payer: Self-pay | Admitting: Cardiovascular Disease

## 2017-10-11 NOTE — Telephone Encounter (Signed)
New Message    *STAT* If patient is at the pharmacy, call can be transferred to refill team.   1. Which medications need to be refilled? (please list name of each medication and dose if known) amLODipine (NORVASC) 2.5 MG tablet  2. Which pharmacy/location (including street and city if local pharmacy) is medication to be sent to? [facility]   3. Do they need a 30 day or 90 day supply? 90 day supply

## 2017-10-11 NOTE — Telephone Encounter (Signed)
Rx request sent to pharmacy.  

## 2018-01-17 DIAGNOSIS — E7800 Pure hypercholesterolemia, unspecified: Secondary | ICD-10-CM | POA: Diagnosis not present

## 2018-01-17 DIAGNOSIS — M8588 Other specified disorders of bone density and structure, other site: Secondary | ICD-10-CM | POA: Diagnosis not present

## 2018-01-17 DIAGNOSIS — G4733 Obstructive sleep apnea (adult) (pediatric): Secondary | ICD-10-CM | POA: Diagnosis not present

## 2018-01-17 DIAGNOSIS — Z0000 Encounter for general adult medical examination without abnormal findings: Secondary | ICD-10-CM | POA: Diagnosis not present

## 2018-01-17 DIAGNOSIS — M150 Primary generalized (osteo)arthritis: Secondary | ICD-10-CM | POA: Diagnosis not present

## 2018-01-17 DIAGNOSIS — Z1389 Encounter for screening for other disorder: Secondary | ICD-10-CM | POA: Diagnosis not present

## 2018-01-17 DIAGNOSIS — I10 Essential (primary) hypertension: Secondary | ICD-10-CM | POA: Diagnosis not present

## 2018-04-05 DIAGNOSIS — Z85828 Personal history of other malignant neoplasm of skin: Secondary | ICD-10-CM | POA: Diagnosis not present

## 2018-04-05 DIAGNOSIS — D485 Neoplasm of uncertain behavior of skin: Secondary | ICD-10-CM | POA: Diagnosis not present

## 2018-04-05 DIAGNOSIS — D225 Melanocytic nevi of trunk: Secondary | ICD-10-CM | POA: Diagnosis not present

## 2018-04-05 DIAGNOSIS — L821 Other seborrheic keratosis: Secondary | ICD-10-CM | POA: Diagnosis not present

## 2018-04-05 DIAGNOSIS — D0439 Carcinoma in situ of skin of other parts of face: Secondary | ICD-10-CM | POA: Diagnosis not present

## 2018-04-05 DIAGNOSIS — L814 Other melanin hyperpigmentation: Secondary | ICD-10-CM | POA: Diagnosis not present

## 2018-04-05 DIAGNOSIS — Z23 Encounter for immunization: Secondary | ICD-10-CM | POA: Diagnosis not present

## 2018-04-28 DIAGNOSIS — G4733 Obstructive sleep apnea (adult) (pediatric): Secondary | ICD-10-CM | POA: Diagnosis not present

## 2018-05-09 DIAGNOSIS — Z23 Encounter for immunization: Secondary | ICD-10-CM | POA: Diagnosis not present

## 2018-05-09 DIAGNOSIS — L249 Irritant contact dermatitis, unspecified cause: Secondary | ICD-10-CM | POA: Diagnosis not present

## 2018-06-13 DIAGNOSIS — Z23 Encounter for immunization: Secondary | ICD-10-CM | POA: Diagnosis not present

## 2018-06-13 DIAGNOSIS — D0430 Carcinoma in situ of skin of unspecified part of face: Secondary | ICD-10-CM | POA: Diagnosis not present

## 2018-07-03 ENCOUNTER — Other Ambulatory Visit: Payer: Self-pay | Admitting: Family Medicine

## 2018-07-03 DIAGNOSIS — Z1231 Encounter for screening mammogram for malignant neoplasm of breast: Secondary | ICD-10-CM

## 2018-07-20 DIAGNOSIS — I10 Essential (primary) hypertension: Secondary | ICD-10-CM | POA: Diagnosis not present

## 2018-07-20 DIAGNOSIS — M79609 Pain in unspecified limb: Secondary | ICD-10-CM | POA: Diagnosis not present

## 2018-07-20 DIAGNOSIS — E7800 Pure hypercholesterolemia, unspecified: Secondary | ICD-10-CM | POA: Diagnosis not present

## 2018-07-20 DIAGNOSIS — G4733 Obstructive sleep apnea (adult) (pediatric): Secondary | ICD-10-CM | POA: Diagnosis not present

## 2018-07-20 DIAGNOSIS — H6120 Impacted cerumen, unspecified ear: Secondary | ICD-10-CM | POA: Diagnosis not present

## 2018-07-31 DIAGNOSIS — G4733 Obstructive sleep apnea (adult) (pediatric): Secondary | ICD-10-CM | POA: Diagnosis not present

## 2018-08-01 DIAGNOSIS — M25572 Pain in left ankle and joints of left foot: Secondary | ICD-10-CM | POA: Diagnosis not present

## 2018-08-02 DIAGNOSIS — G4733 Obstructive sleep apnea (adult) (pediatric): Secondary | ICD-10-CM | POA: Diagnosis not present

## 2018-08-04 ENCOUNTER — Ambulatory Visit: Payer: PPO

## 2018-08-08 ENCOUNTER — Ambulatory Visit
Admission: RE | Admit: 2018-08-08 | Discharge: 2018-08-08 | Disposition: A | Discharge status: Home / Self Care | Payer: PPO | Source: Ambulatory Visit | Attending: Family Medicine | Admitting: Family Medicine

## 2018-08-08 DIAGNOSIS — Z1231 Encounter for screening mammogram for malignant neoplasm of breast: Secondary | ICD-10-CM

## 2018-08-14 DIAGNOSIS — H524 Presbyopia: Secondary | ICD-10-CM | POA: Diagnosis not present

## 2018-08-22 DIAGNOSIS — M25572 Pain in left ankle and joints of left foot: Secondary | ICD-10-CM | POA: Diagnosis not present

## 2018-09-20 DIAGNOSIS — M25572 Pain in left ankle and joints of left foot: Secondary | ICD-10-CM | POA: Diagnosis not present

## 2018-10-01 DIAGNOSIS — G4733 Obstructive sleep apnea (adult) (pediatric): Secondary | ICD-10-CM | POA: Diagnosis not present

## 2018-10-10 DIAGNOSIS — G4733 Obstructive sleep apnea (adult) (pediatric): Secondary | ICD-10-CM | POA: Diagnosis not present

## 2018-10-25 DIAGNOSIS — G4733 Obstructive sleep apnea (adult) (pediatric): Secondary | ICD-10-CM | POA: Diagnosis not present

## 2018-10-31 DIAGNOSIS — G4733 Obstructive sleep apnea (adult) (pediatric): Secondary | ICD-10-CM | POA: Diagnosis not present

## 2018-12-01 DIAGNOSIS — G4733 Obstructive sleep apnea (adult) (pediatric): Secondary | ICD-10-CM | POA: Diagnosis not present

## 2018-12-12 DIAGNOSIS — L719 Rosacea, unspecified: Secondary | ICD-10-CM | POA: Diagnosis not present

## 2018-12-12 DIAGNOSIS — Z86007 Personal history of in-situ neoplasm of skin: Secondary | ICD-10-CM | POA: Diagnosis not present

## 2018-12-31 DIAGNOSIS — G4733 Obstructive sleep apnea (adult) (pediatric): Secondary | ICD-10-CM | POA: Diagnosis not present

## 2019-01-31 DIAGNOSIS — G4733 Obstructive sleep apnea (adult) (pediatric): Secondary | ICD-10-CM | POA: Diagnosis not present

## 2019-02-20 ENCOUNTER — Other Ambulatory Visit: Payer: Self-pay | Admitting: Family Medicine

## 2019-02-20 DIAGNOSIS — Z1389 Encounter for screening for other disorder: Secondary | ICD-10-CM | POA: Diagnosis not present

## 2019-02-20 DIAGNOSIS — M8580 Other specified disorders of bone density and structure, unspecified site: Secondary | ICD-10-CM

## 2019-02-20 DIAGNOSIS — M150 Primary generalized (osteo)arthritis: Secondary | ICD-10-CM | POA: Diagnosis not present

## 2019-02-20 DIAGNOSIS — G4733 Obstructive sleep apnea (adult) (pediatric): Secondary | ICD-10-CM | POA: Diagnosis not present

## 2019-02-20 DIAGNOSIS — I10 Essential (primary) hypertension: Secondary | ICD-10-CM | POA: Diagnosis not present

## 2019-02-20 DIAGNOSIS — Z0000 Encounter for general adult medical examination without abnormal findings: Secondary | ICD-10-CM | POA: Diagnosis not present

## 2019-02-20 DIAGNOSIS — E7800 Pure hypercholesterolemia, unspecified: Secondary | ICD-10-CM | POA: Diagnosis not present

## 2019-02-20 DIAGNOSIS — M549 Dorsalgia, unspecified: Secondary | ICD-10-CM | POA: Diagnosis not present

## 2019-02-20 DIAGNOSIS — M8588 Other specified disorders of bone density and structure, other site: Secondary | ICD-10-CM | POA: Diagnosis not present

## 2019-03-03 DIAGNOSIS — G4733 Obstructive sleep apnea (adult) (pediatric): Secondary | ICD-10-CM | POA: Diagnosis not present

## 2019-04-02 DIAGNOSIS — G4733 Obstructive sleep apnea (adult) (pediatric): Secondary | ICD-10-CM | POA: Diagnosis not present

## 2019-04-12 DIAGNOSIS — Z23 Encounter for immunization: Secondary | ICD-10-CM | POA: Diagnosis not present

## 2019-04-12 DIAGNOSIS — L821 Other seborrheic keratosis: Secondary | ICD-10-CM | POA: Diagnosis not present

## 2019-04-12 DIAGNOSIS — L570 Actinic keratosis: Secondary | ICD-10-CM | POA: Diagnosis not present

## 2019-04-12 DIAGNOSIS — D225 Melanocytic nevi of trunk: Secondary | ICD-10-CM | POA: Diagnosis not present

## 2019-04-12 DIAGNOSIS — Z85828 Personal history of other malignant neoplasm of skin: Secondary | ICD-10-CM | POA: Diagnosis not present

## 2019-04-12 DIAGNOSIS — L814 Other melanin hyperpigmentation: Secondary | ICD-10-CM | POA: Diagnosis not present

## 2019-04-12 DIAGNOSIS — J3489 Other specified disorders of nose and nasal sinuses: Secondary | ICD-10-CM | POA: Diagnosis not present

## 2019-05-03 DIAGNOSIS — G4733 Obstructive sleep apnea (adult) (pediatric): Secondary | ICD-10-CM | POA: Diagnosis not present

## 2019-05-07 ENCOUNTER — Other Ambulatory Visit: Payer: Self-pay

## 2019-05-07 ENCOUNTER — Ambulatory Visit
Admission: RE | Admit: 2019-05-07 | Discharge: 2019-05-07 | Disposition: A | Discharge status: Home / Self Care | Payer: PPO | Source: Ambulatory Visit | Attending: Family Medicine | Admitting: Family Medicine

## 2019-05-07 DIAGNOSIS — M8589 Other specified disorders of bone density and structure, multiple sites: Secondary | ICD-10-CM | POA: Diagnosis not present

## 2019-05-07 DIAGNOSIS — Z780 Asymptomatic menopausal state: Secondary | ICD-10-CM | POA: Diagnosis not present

## 2019-05-07 DIAGNOSIS — M8580 Other specified disorders of bone density and structure, unspecified site: Secondary | ICD-10-CM

## 2019-05-09 DIAGNOSIS — G4733 Obstructive sleep apnea (adult) (pediatric): Secondary | ICD-10-CM | POA: Diagnosis not present

## 2019-06-02 DIAGNOSIS — G4733 Obstructive sleep apnea (adult) (pediatric): Secondary | ICD-10-CM | POA: Diagnosis not present

## 2019-07-03 DIAGNOSIS — G4733 Obstructive sleep apnea (adult) (pediatric): Secondary | ICD-10-CM | POA: Diagnosis not present

## 2019-07-20 ENCOUNTER — Ambulatory Visit: Payer: PPO | Attending: Internal Medicine

## 2019-07-20 ENCOUNTER — Other Ambulatory Visit: Payer: Self-pay

## 2019-07-20 DIAGNOSIS — Z23 Encounter for immunization: Secondary | ICD-10-CM

## 2019-07-20 NOTE — Progress Notes (Signed)
   Covid-19 Vaccination Clinic  Name:  Amber Sloan    MRN: [id number] DOB: Nov 09, 1940  07/20/2019  [name] was observed post Covid-19 immunization for 15 minutes without incidence. She was provided with Vaccine Information Sheet and instruction to access the V-Safe system.   [name] was instructed to call 911 with any severe reactions post vaccine: Marland Kitchen Difficulty breathing  . Swelling of your face and throat  . A fast heartbeat  . A bad rash all over your body  . Dizziness and weakness    Immunizations Administered    Name Date Dose VIS Date Route   Pfizer COVID-19 Vaccine 07/20/2019  8:51 AM 0.3 mL 06/08/2019 Intramuscular   Manufacturer: [place]   Lot: [id number]   [place]: [id number]

## 2019-08-07 DIAGNOSIS — G4733 Obstructive sleep apnea (adult) (pediatric): Secondary | ICD-10-CM | POA: Diagnosis not present

## 2019-08-09 ENCOUNTER — Ambulatory Visit: Payer: PPO | Attending: Internal Medicine

## 2019-08-09 DIAGNOSIS — Z23 Encounter for immunization: Secondary | ICD-10-CM | POA: Insufficient documentation

## 2019-08-09 NOTE — Progress Notes (Signed)
   Covid-19 Vaccination Clinic  Name:  Amber Sloan    MRN: [id number] DOB: [birth year]  08/09/2019  [name] was observed post Covid-19 immunization for 15 minutes without incidence. She was provided with Vaccine Information Sheet and instruction to access the V-Safe system.   [name] was instructed to call 911 with any severe reactions post vaccine: Marland Kitchen Difficulty breathing  . Swelling of your face and throat  . A fast heartbeat  . A bad rash all over your body  . Dizziness and weakness    Immunizations Administered    Name Date Dose VIS Date Route   Pfizer COVID-19 Vaccine 08/09/2019  1:26 PM 0.3 mL 06/08/2019 Intramuscular   Manufacturer: [place]   Lot: [id number]   [place]: [id number]

## 2019-08-14 ENCOUNTER — Other Ambulatory Visit: Payer: Self-pay | Admitting: Family Medicine

## 2019-08-14 DIAGNOSIS — Z1231 Encounter for screening mammogram for malignant neoplasm of breast: Secondary | ICD-10-CM

## 2019-08-16 IMAGING — MG DIGITAL SCREENING BILATERAL MAMMOGRAM WITH TOMO AND CAD
8 series · 8 of 24 positions shown · non-contrast
Comparison: Previous exam(s).

CLINICAL DATA: Screening.

EXAM:
DIGITAL SCREENING BILATERAL MAMMOGRAM WITH TOMO AND CAD

[R MLO synth-2D]
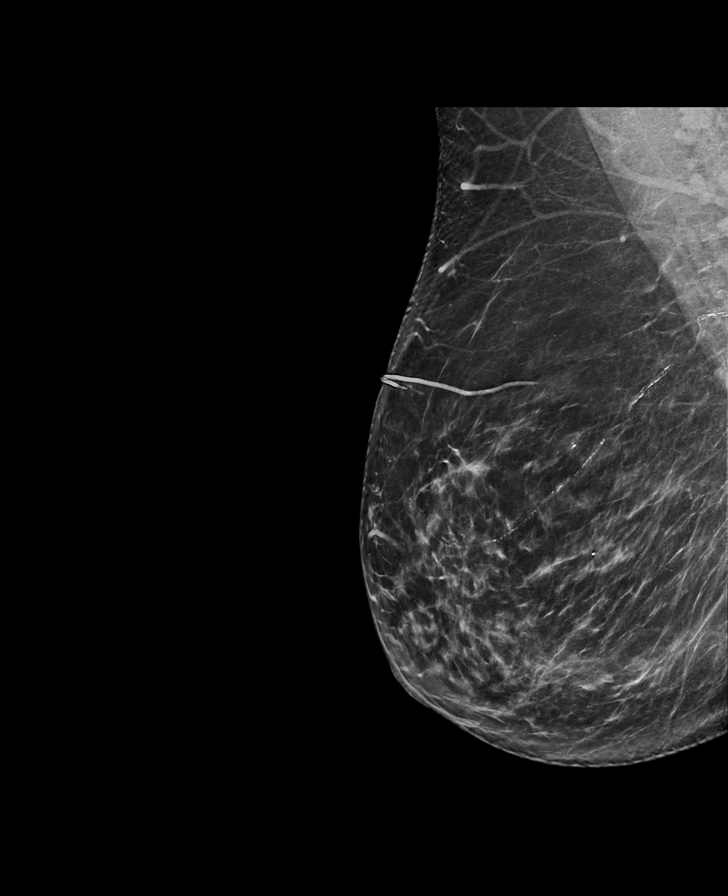

[L CC synth-2D]
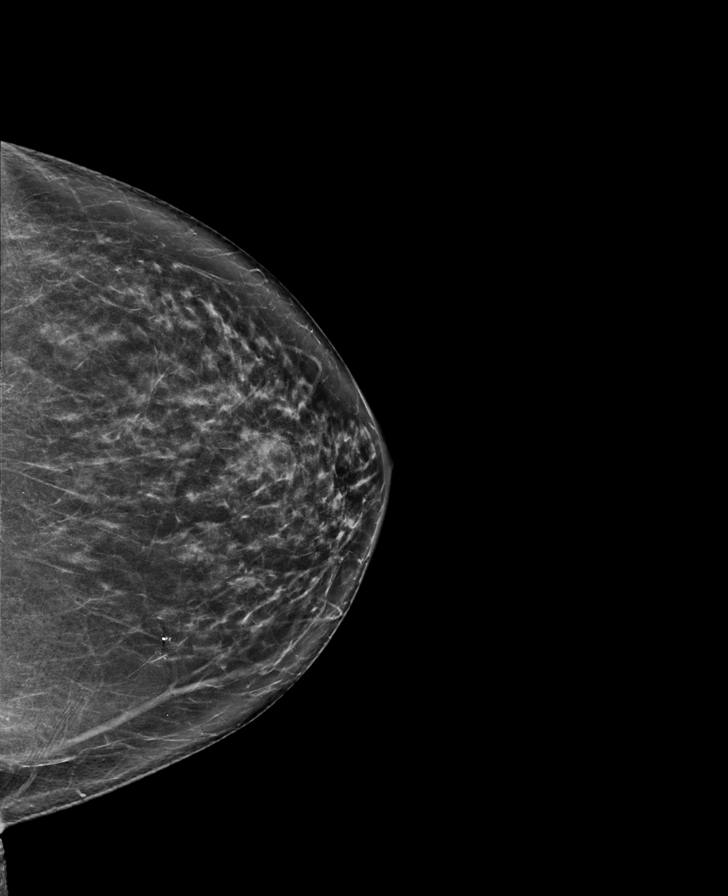

[R CC synth-2D]
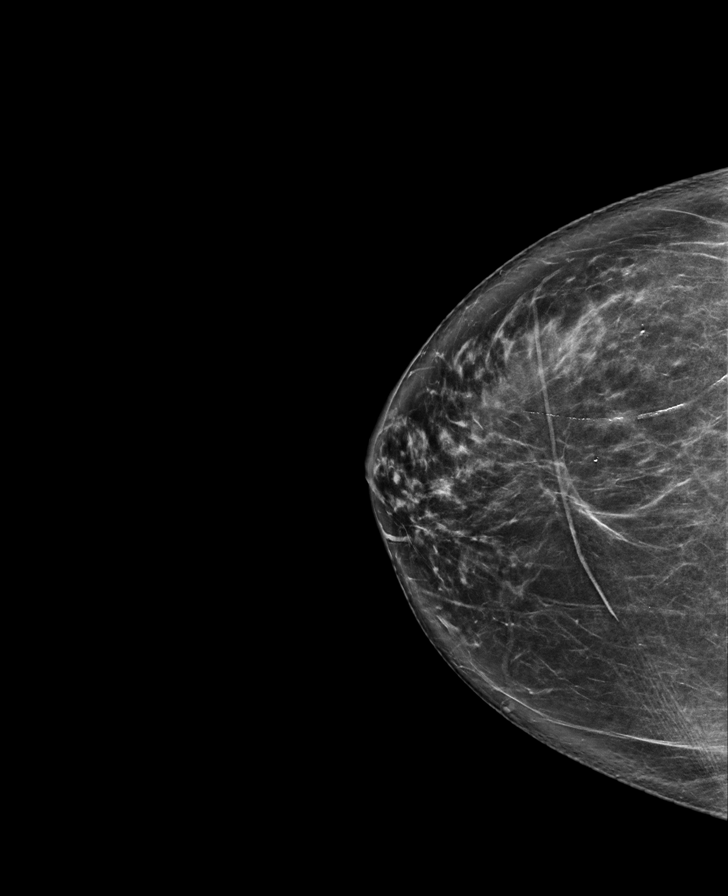

[L MLO synth-2D]
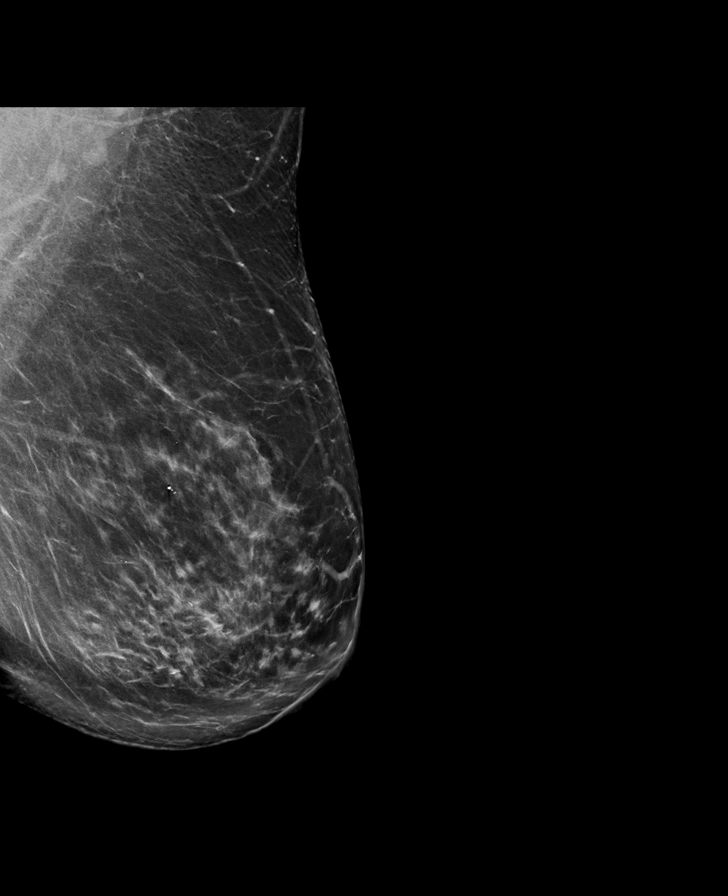

[R MLO tomo · tomo slice 41/80.0]
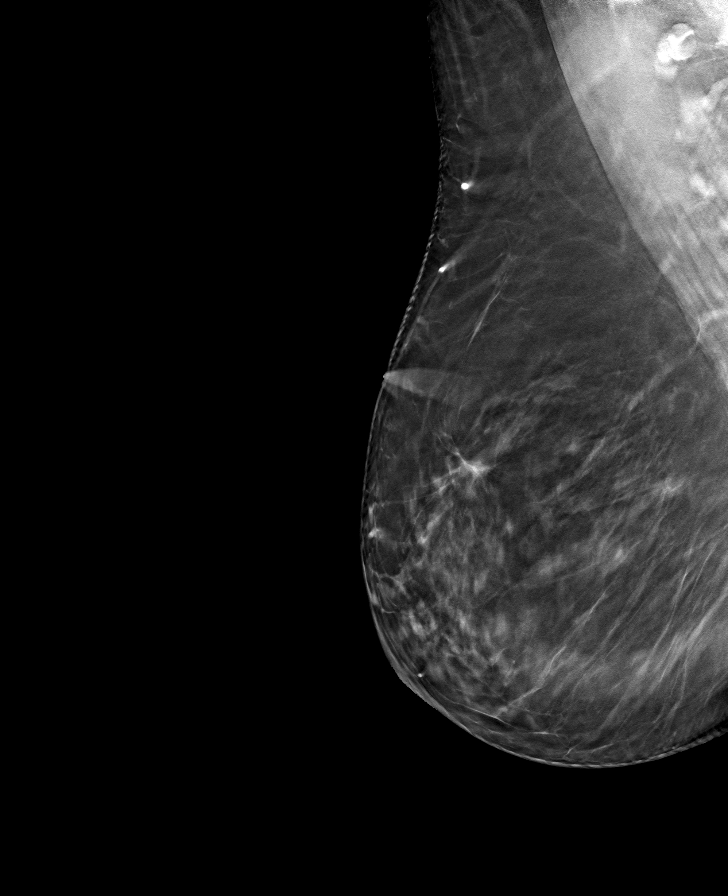

[R CC tomo · tomo slice 38/75.0]
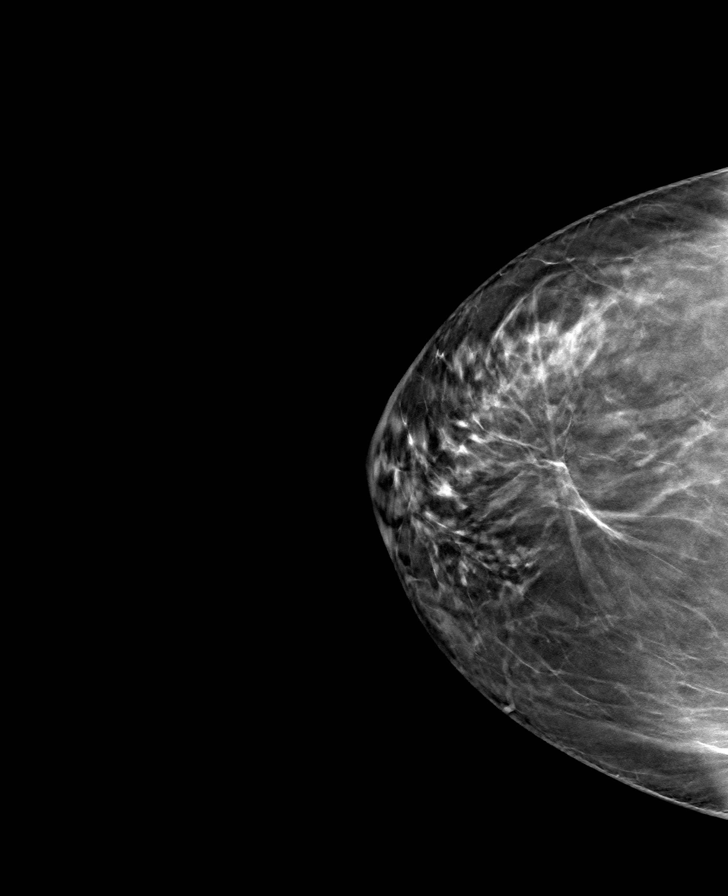

[L CC tomo · tomo slice 37/73.0]
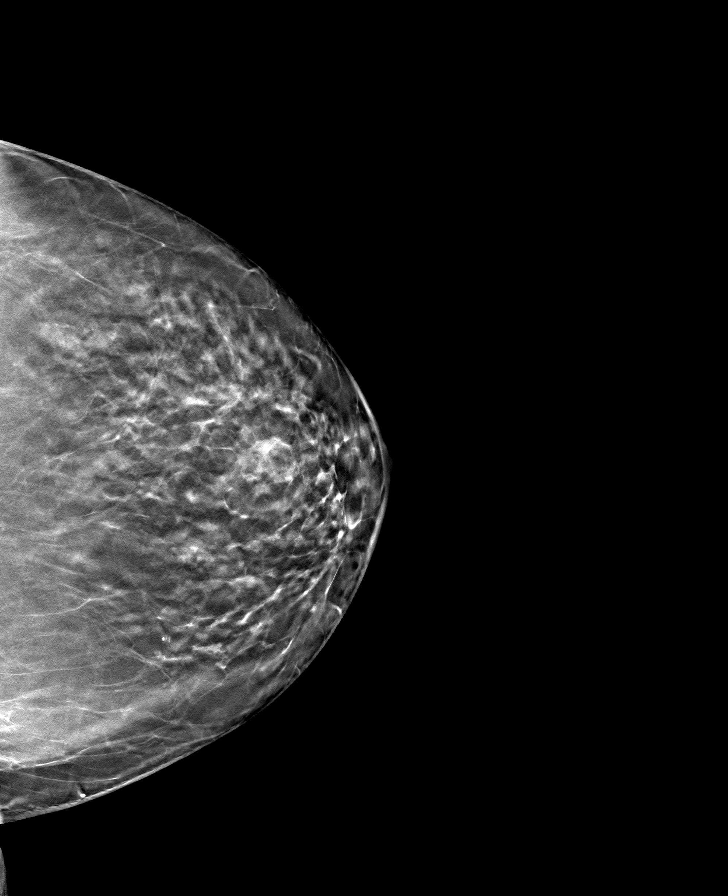

[L MLO tomo · tomo slice 47/92.0]
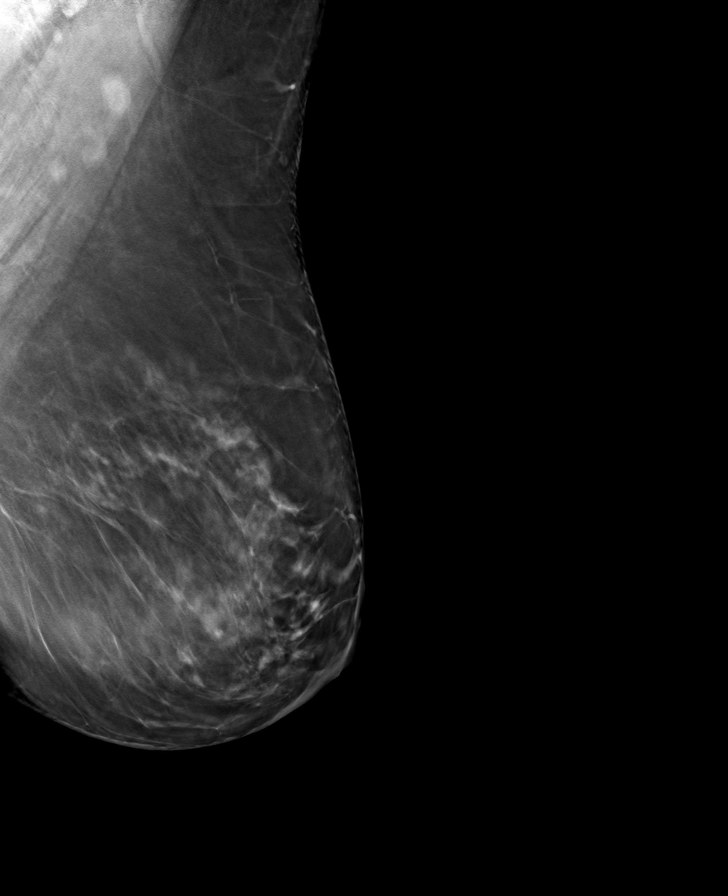

[8 of 24 positions shown; findings below may reference images not displayed]

ACR Breast Density Category c: The breast tissue is heterogeneously
dense, which may obscure small masses.
FINDINGS: There are no findings suspicious for malignancy. Images were
processed with CAD.
IMPRESSION: No mammographic evidence of malignancy. A result letter of this
screening mammogram will be mailed directly to the patient.

RECOMMENDATION:
Screening mammogram in one year. (Code:[id number]-[id number])

BI-RADS CATEGORY  1: Negative.

## 2019-08-20 DIAGNOSIS — H524 Presbyopia: Secondary | ICD-10-CM | POA: Diagnosis not present

## 2019-08-20 DIAGNOSIS — Z961 Presence of intraocular lens: Secondary | ICD-10-CM | POA: Diagnosis not present

## 2019-08-23 DIAGNOSIS — E7800 Pure hypercholesterolemia, unspecified: Secondary | ICD-10-CM | POA: Diagnosis not present

## 2019-09-07 DIAGNOSIS — G4733 Obstructive sleep apnea (adult) (pediatric): Secondary | ICD-10-CM | POA: Diagnosis not present

## 2019-09-10 DIAGNOSIS — E871 Hypo-osmolality and hyponatremia: Secondary | ICD-10-CM | POA: Diagnosis not present

## 2019-09-10 DIAGNOSIS — R7301 Impaired fasting glucose: Secondary | ICD-10-CM | POA: Diagnosis not present

## 2019-09-20 ENCOUNTER — Ambulatory Visit
Admission: RE | Admit: 2019-09-20 | Discharge: 2019-09-20 | Disposition: A | Discharge status: Home / Self Care | Payer: PPO | Source: Ambulatory Visit | Attending: Family Medicine | Admitting: Family Medicine

## 2019-09-20 ENCOUNTER — Other Ambulatory Visit: Payer: Self-pay

## 2019-09-20 DIAGNOSIS — Z1231 Encounter for screening mammogram for malignant neoplasm of breast: Secondary | ICD-10-CM

## 2019-09-28 ENCOUNTER — Other Ambulatory Visit: Payer: Self-pay | Admitting: Family Medicine

## 2019-09-28 DIAGNOSIS — R928 Other abnormal and inconclusive findings on diagnostic imaging of breast: Secondary | ICD-10-CM

## 2019-10-01 DIAGNOSIS — G4733 Obstructive sleep apnea (adult) (pediatric): Secondary | ICD-10-CM | POA: Diagnosis not present

## 2019-10-05 ENCOUNTER — Other Ambulatory Visit: Payer: Self-pay | Admitting: Family Medicine

## 2019-10-05 ENCOUNTER — Ambulatory Visit
Admission: RE | Admit: 2019-10-05 | Discharge: 2019-10-05 | Disposition: A | Discharge status: Home / Self Care | Payer: PPO | Source: Ambulatory Visit | Attending: Family Medicine | Admitting: Family Medicine

## 2019-10-05 ENCOUNTER — Other Ambulatory Visit: Payer: Self-pay

## 2019-10-05 DIAGNOSIS — R928 Other abnormal and inconclusive findings on diagnostic imaging of breast: Secondary | ICD-10-CM

## 2019-10-05 DIAGNOSIS — G4733 Obstructive sleep apnea (adult) (pediatric): Secondary | ICD-10-CM | POA: Diagnosis not present

## 2019-10-05 DIAGNOSIS — R921 Mammographic calcification found on diagnostic imaging of breast: Secondary | ICD-10-CM

## 2019-11-08 DIAGNOSIS — G4733 Obstructive sleep apnea (adult) (pediatric): Secondary | ICD-10-CM | POA: Diagnosis not present

## 2020-02-21 DIAGNOSIS — M150 Primary generalized (osteo)arthritis: Secondary | ICD-10-CM | POA: Diagnosis not present

## 2020-02-21 DIAGNOSIS — E7800 Pure hypercholesterolemia, unspecified: Secondary | ICD-10-CM | POA: Diagnosis not present

## 2020-02-21 DIAGNOSIS — G4733 Obstructive sleep apnea (adult) (pediatric): Secondary | ICD-10-CM | POA: Diagnosis not present

## 2020-02-21 DIAGNOSIS — M8588 Other specified disorders of bone density and structure, other site: Secondary | ICD-10-CM | POA: Diagnosis not present

## 2020-02-21 DIAGNOSIS — Z0000 Encounter for general adult medical examination without abnormal findings: Secondary | ICD-10-CM | POA: Diagnosis not present

## 2020-02-21 DIAGNOSIS — I10 Essential (primary) hypertension: Secondary | ICD-10-CM | POA: Diagnosis not present

## 2020-02-21 DIAGNOSIS — M549 Dorsalgia, unspecified: Secondary | ICD-10-CM | POA: Diagnosis not present

## 2020-02-21 DIAGNOSIS — Z1389 Encounter for screening for other disorder: Secondary | ICD-10-CM | POA: Diagnosis not present

## 2020-02-21 DIAGNOSIS — K5900 Constipation, unspecified: Secondary | ICD-10-CM | POA: Diagnosis not present

## 2020-02-21 DIAGNOSIS — R7303 Prediabetes: Secondary | ICD-10-CM | POA: Diagnosis not present

## 2020-03-14 DIAGNOSIS — G4733 Obstructive sleep apnea (adult) (pediatric): Secondary | ICD-10-CM | POA: Diagnosis not present

## 2020-04-07 ENCOUNTER — Other Ambulatory Visit: Payer: Self-pay | Admitting: Family Medicine

## 2020-04-07 ENCOUNTER — Ambulatory Visit
Admission: RE | Admit: 2020-04-07 | Discharge: 2020-04-07 | Disposition: A | Discharge status: Home / Self Care | Payer: PPO | Source: Ambulatory Visit | Attending: Family Medicine | Admitting: Family Medicine

## 2020-04-07 ENCOUNTER — Other Ambulatory Visit: Payer: Self-pay

## 2020-04-07 DIAGNOSIS — R921 Mammographic calcification found on diagnostic imaging of breast: Secondary | ICD-10-CM

## 2020-04-24 DIAGNOSIS — L814 Other melanin hyperpigmentation: Secondary | ICD-10-CM | POA: Diagnosis not present

## 2020-04-24 DIAGNOSIS — D225 Melanocytic nevi of trunk: Secondary | ICD-10-CM | POA: Diagnosis not present

## 2020-04-24 DIAGNOSIS — L578 Other skin changes due to chronic exposure to nonionizing radiation: Secondary | ICD-10-CM | POA: Diagnosis not present

## 2020-04-24 DIAGNOSIS — L570 Actinic keratosis: Secondary | ICD-10-CM | POA: Diagnosis not present

## 2020-04-24 DIAGNOSIS — Z85828 Personal history of other malignant neoplasm of skin: Secondary | ICD-10-CM | POA: Diagnosis not present

## 2020-04-24 DIAGNOSIS — L821 Other seborrheic keratosis: Secondary | ICD-10-CM | POA: Diagnosis not present

## 2020-07-11 DIAGNOSIS — G4733 Obstructive sleep apnea (adult) (pediatric): Secondary | ICD-10-CM | POA: Diagnosis not present

## 2020-07-14 DIAGNOSIS — G4733 Obstructive sleep apnea (adult) (pediatric): Secondary | ICD-10-CM | POA: Diagnosis not present

## 2020-07-30 DIAGNOSIS — L570 Actinic keratosis: Secondary | ICD-10-CM | POA: Diagnosis not present

## 2020-08-19 DIAGNOSIS — H524 Presbyopia: Secondary | ICD-10-CM | POA: Diagnosis not present

## 2020-08-19 DIAGNOSIS — Z961 Presence of intraocular lens: Secondary | ICD-10-CM | POA: Diagnosis not present

## 2020-08-25 DIAGNOSIS — E7800 Pure hypercholesterolemia, unspecified: Secondary | ICD-10-CM | POA: Diagnosis not present

## 2020-08-25 DIAGNOSIS — G4733 Obstructive sleep apnea (adult) (pediatric): Secondary | ICD-10-CM | POA: Diagnosis not present

## 2020-08-25 DIAGNOSIS — E673 Hypervitaminosis D: Secondary | ICD-10-CM | POA: Diagnosis not present

## 2020-08-25 DIAGNOSIS — R7303 Prediabetes: Secondary | ICD-10-CM | POA: Diagnosis not present

## 2020-08-25 DIAGNOSIS — I10 Essential (primary) hypertension: Secondary | ICD-10-CM | POA: Diagnosis not present

## 2020-09-19 DIAGNOSIS — X32XXXA Exposure to sunlight, initial encounter: Secondary | ICD-10-CM | POA: Diagnosis not present

## 2020-09-19 DIAGNOSIS — L298 Other pruritus: Secondary | ICD-10-CM | POA: Diagnosis not present

## 2020-09-19 DIAGNOSIS — L570 Actinic keratosis: Secondary | ICD-10-CM | POA: Diagnosis not present

## 2020-10-07 ENCOUNTER — Ambulatory Visit
Admission: RE | Admit: 2020-10-07 | Discharge: 2020-10-07 | Disposition: A | Discharge status: Home / Self Care | Payer: PPO | Source: Ambulatory Visit | Attending: Family Medicine | Admitting: Family Medicine

## 2020-10-07 ENCOUNTER — Other Ambulatory Visit: Payer: Self-pay

## 2020-10-07 DIAGNOSIS — R921 Mammographic calcification found on diagnostic imaging of breast: Secondary | ICD-10-CM | POA: Diagnosis not present

## 2020-10-07 DIAGNOSIS — R922 Inconclusive mammogram: Secondary | ICD-10-CM | POA: Diagnosis not present

## 2020-10-23 DIAGNOSIS — G4733 Obstructive sleep apnea (adult) (pediatric): Secondary | ICD-10-CM | POA: Diagnosis not present

## 2020-11-03 DIAGNOSIS — M545 Low back pain, unspecified: Secondary | ICD-10-CM | POA: Diagnosis not present

## 2020-11-03 DIAGNOSIS — M1712 Unilateral primary osteoarthritis, left knee: Secondary | ICD-10-CM | POA: Diagnosis not present

## 2020-11-03 DIAGNOSIS — M1711 Unilateral primary osteoarthritis, right knee: Secondary | ICD-10-CM | POA: Diagnosis not present

## 2021-01-21 DIAGNOSIS — G4733 Obstructive sleep apnea (adult) (pediatric): Secondary | ICD-10-CM | POA: Diagnosis not present

## 2021-02-02 ENCOUNTER — Other Ambulatory Visit: Payer: Self-pay

## 2021-02-02 ENCOUNTER — Ambulatory Visit: Payer: PPO | Admitting: Podiatry

## 2021-02-02 ENCOUNTER — Encounter: Payer: Self-pay | Admitting: Podiatry

## 2021-02-02 DIAGNOSIS — M79675 Pain in left toe(s): Secondary | ICD-10-CM | POA: Diagnosis not present

## 2021-02-02 DIAGNOSIS — L600 Ingrowing nail: Secondary | ICD-10-CM | POA: Diagnosis not present

## 2021-02-02 DIAGNOSIS — L539 Erythematous condition, unspecified: Secondary | ICD-10-CM

## 2021-02-02 DIAGNOSIS — M79674 Pain in right toe(s): Secondary | ICD-10-CM

## 2021-02-02 MED ORDER — CEPHALEXIN 500 MG PO CAPS: 500.0000 mg | ORAL_CAPSULE | Freq: Three times a day (TID) | ORAL | 0 refills | Status: DC

## 2021-02-02 NOTE — Patient Instructions (Addendum)

## 2021-02-05 NOTE — Progress Notes (Signed)
Subjective:   Patient ID: Amber Sloan, female   DOB: 80 y.o.   MRN: [id number]   HPI 80 year old female presents the office today for concerns of ingrown toenails left side worse than right, medial aspect.  She said her husband tries to trim on the wound but no other treatments.  They do hurt him mostly with pressure.  No drainage or pus.  Also she is notices some red color on the pads of her second and third toes but no open sores.  No injury.  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  No history of ulcerations.  No other concerns.  No claudication symptoms.   Review of Systems  All other systems reviewed and are negative.  Past Medical History:  Diagnosis Date   Anginal pain ([place])    had cardiac work up with Dr Gwenlyn Found, had a normal Stress test.   Arthritis    Breast mass, right    Hx of (Dr. Criss Rosales)   Chest pain    Constipation    Diverticulitis    Dysrhythmia    Environmental allergies    Family history of heart disease    Hyperlipidemia    pure, continue on Simvastatin   Hypertension    benign, uncontrolled, continue on Benazepril-Hydrochlorothiazide   Menopause    Obesity, unspecified    Osteopenia    on Evista since 2007   Peripheral neuropathy    left   PONV (postoperative nausea and vomiting)    Shortness of breath dyspnea    when walking upastairs    Sleep apnea    on cpap   Squamous cell carcinoma of face     Past Surgical History:  Procedure Laterality Date   ABDOMINAL HYSTERECTOMY  1986   still has Ovaries   ABDOMINAL HYSTERECTOMY     BACK SURGERY     BREAST EXCISIONAL BIOPSY Right    BREAST FIBROADENOMA SURGERY  03/1997   Right breast   CHOLECYSTECTOMY N/A 05/16/2014   Procedure: LAPAROSCOPIC CHOLECYSTECTOMY WITH INTRAOPERATIVE CHOLANGIOGRAM;  Surgeon: [name], MD;  Location: [place];  Service: General;  Laterality: N/A;   COLONOSCOPY     EVALUATION UNDER ANESTHESIA WITH STRABISMUS REPAIR     LUMBAR FUSION  10/2005   5,     TONSILLECTOMY     TONSILLECTOMY AND ADENOIDECTOMY       Current Outpatient Medications:    cephALEXin (KEFLEX) 500 MG capsule, Take 1 capsule (500 mg total) by mouth 3 (three) times daily., Disp: 21 capsule, Rfl: 0   acetaminophen (TYLENOL ARTHRITIS PAIN) 650 MG CR tablet, Take 1,300 mg by mouth at bedtime., Disp: , Rfl:    amLODipine (NORVASC) 2.5 MG tablet, TAKE 1 TABLET BY MOUTH DAILY, Disp: 30 tablet, Rfl: 6   aspirin 81 MG EC tablet, 1 tablet, Disp: , Rfl:    benazepril-hydrochlorthiazide (LOTENSIN HCT) 10-12.5 MG per tablet, Take 1 tablet by mouth every morning., Disp: , Rfl:    Flaxseed, Linseed, (FLAX SEEDS PO), Take 1,200 mg by mouth daily., Disp: , Rfl:    fluticasone (CUTIVATE) 0.05 % cream, Apply 1 application topically daily., Disp: , Rfl:    GARLIC OIL PO, Take 1 capsule by mouth daily., Disp: , Rfl:    GLUCOSAMINE HCL PO, Take 1 tablet by mouth daily., Disp: , Rfl:    loratadine-pseudoephedrine (CLARITIN-D 24-HOUR) 10-240 MG per 24 hr tablet, Take 1 tablet by mouth as needed for allergies., Disp: , Rfl:    Magnesium 500 MG CAPS,  Take 1 capsule by mouth daily., Disp: , Rfl:    Multiple Vitamin (MULTIVITAMIN WITH MINERALS) TABS tablet, Take 1 tablet by mouth daily., Disp: , Rfl:    polyethylene glycol (MIRALAX / GLYCOLAX) packet, Take 17 g by mouth daily., Disp: , Rfl:    raloxifene (EVISTA) 60 MG tablet, Take 60 mg by mouth daily., Disp: , Rfl:    simvastatin (ZOCOR) 20 MG tablet, Take 20 mg by mouth daily., Disp: , Rfl:   Allergies  Allergen Reactions   Parafon Forte Dsc [Chlorzoxazone] Other (See Comments)    Old allergy unknown reaction.    Sulfa Antibiotics Nausea And Vomiting    Drugs          Objective:  Physical Exam  General: AAO x3, NAD  Dermatological: Incurvation present to the medial aspects of bilateral hallux toenails left side worse than right with localized edema.  There is no drainage or pus or ascending cellulitis.  There is no open lesions.   There is slight erythema on the plantar aspect of the second and third digits on the right portion of the toe there is no skin breakdown or warmth.  Vascular: Dorsalis Pedis artery and Posterior Tibial artery pedal pulses are 2/4 bilateral with immedate capillary fill time. There is no pain with calf compression, swelling, warmth, erythema.   Neruologic: Grossly intact via light touch bilateral.   Musculoskeletal: Tenderness on the) tenderness no other areas of discomfort muscular strength 5/5 in all groups tested bilateral.  Gait: Unassisted, Nonantalgic.       Assessment:   Bilateral hallux medial ingrown toenails; Erythema plantar toes likely from pressure     Plan:  -Treatment options discussed including all alternatives, risks, and complications -Etiology of symptoms were discussed -At this time, the patient is requesting partial nail removal with chemical matricectomy to the symptomatic portion of the nail. Risks and complications were discussed with the patient for which they understand and written consent was obtained. Under sterile conditions a total of 3 mL of a mixture of 2% lidocaine plain and 0.5% Marcaine plain was infiltrated in a hallux block fashion. Once anesthetized, the skin was prepped in sterile fashion. A tourniquet was then applied. Next the medial aspect of hallux nail border was then sharply excised making sure to remove the entire offending nail border. Once the nails were ensured to be removed area was debrided and the underlying skin was intact. There is no purulence identified in the procedure. Next phenol was then applied under standard conditions and copiously irrigated. Silvadene was applied. A dry sterile dressing was applied. After application of the dressing the tourniquet was removed and there is found to be an immediate capillary refill time to the digit. The patient tolerated the procedure well any complications. Post procedure instructions were discussed  the patient for which he verbally understood. Follow-up in one week for nail check or sooner if any problems are to arise. Discussed signs/symptoms of infection and directed to call the office immediately should any occur or go directly to the emergency room. In the meantime, encouraged to call the office with any questions, concerns, changes symptoms. -Keflex -Discussed offloading for the digits.  Monitoring skin breakdown.    Trula Slade DPM

## 2021-02-16 ENCOUNTER — Other Ambulatory Visit: Payer: Self-pay

## 2021-02-16 ENCOUNTER — Encounter: Payer: Self-pay | Admitting: Podiatry

## 2021-02-16 ENCOUNTER — Ambulatory Visit: Payer: PPO | Admitting: Podiatry

## 2021-02-16 DIAGNOSIS — L600 Ingrowing nail: Secondary | ICD-10-CM

## 2021-02-16 DIAGNOSIS — L539 Erythematous condition, unspecified: Secondary | ICD-10-CM

## 2021-02-16 DIAGNOSIS — M79674 Pain in right toe(s): Secondary | ICD-10-CM

## 2021-02-16 DIAGNOSIS — M79675 Pain in left toe(s): Secondary | ICD-10-CM

## 2021-02-16 NOTE — Patient Instructions (Signed)
Continue soaking in epsom salts daily followed by antibiotic ointment and a band-aid. Can leave uncovered at night. Continue this until completely healed.  If the area has not healed in 2 weeks, call the office for follow-up appointment, or sooner if any problems arise.  Monitor for any signs/symptoms of infection. Call the office immediately if any occur or go directly to the emergency room. Call with any questions/concerns.

## 2021-02-20 DIAGNOSIS — G4733 Obstructive sleep apnea (adult) (pediatric): Secondary | ICD-10-CM | POA: Diagnosis not present

## 2021-02-23 NOTE — Progress Notes (Signed)
Subjective: 80 year old female presents the office today for follow-up evaluation after undergoing medial hallux partial nail avulsions.  She states that she is healing well.  No swelling redness or any drainage that she reports.  She is having any problems.  She has been soaking Epson salts.  States that toe redness and the other lesser digits have been doing better as well.  No skin breakdown that she reports.  No fevers or chills.  No other concerns.  Objective: AAO x3, NAD DP/PT pulses palpable bilaterally, CRT less than 3 seconds Status post partial nail avulsion.  Small amount of granulation tissue still evident but there is no edema, erythema.  There is no drainage or pus.  No ascending cellulitis.  No significant pain.  There is no significant erythema to the plantar aspect of the lesser digits on the right foot as well.  There is no skin breakdown, ulcerations. No pain with calf compression, swelling, warmth, erythema  Assessment: Status post partial nail avulsion, resolved erythema lesser digits  Plan: -All treatment options discussed with the patient including all alternatives, risks, complications.  -Recommend continue soaking Epson salts cover with antibiotic ointment and a bandage during the day but leave the area open at nighttime.  Continues until the area is healed and scab is formed there is no granulation tissue present.  Monitor closely any signs or symptoms of infection or reoccurrence of ingrown toenail. -Erythema the digits is doing better.  Monitor for any reoccurrence or any skin breakdown. -Patient encouraged to call the office with any questions, concerns, change in symptoms.   Trula Slade DPM

## 2021-02-25 DIAGNOSIS — M8588 Other specified disorders of bone density and structure, other site: Secondary | ICD-10-CM | POA: Diagnosis not present

## 2021-02-25 DIAGNOSIS — Z1389 Encounter for screening for other disorder: Secondary | ICD-10-CM | POA: Diagnosis not present

## 2021-02-25 DIAGNOSIS — E7800 Pure hypercholesterolemia, unspecified: Secondary | ICD-10-CM | POA: Diagnosis not present

## 2021-02-25 DIAGNOSIS — I10 Essential (primary) hypertension: Secondary | ICD-10-CM | POA: Diagnosis not present

## 2021-02-25 DIAGNOSIS — G4733 Obstructive sleep apnea (adult) (pediatric): Secondary | ICD-10-CM | POA: Diagnosis not present

## 2021-02-25 DIAGNOSIS — R7303 Prediabetes: Secondary | ICD-10-CM | POA: Diagnosis not present

## 2021-02-25 DIAGNOSIS — Z0000 Encounter for general adult medical examination without abnormal findings: Secondary | ICD-10-CM | POA: Diagnosis not present

## 2021-03-30 DIAGNOSIS — Z23 Encounter for immunization: Secondary | ICD-10-CM | POA: Diagnosis not present

## 2021-03-30 DIAGNOSIS — L820 Inflamed seborrheic keratosis: Secondary | ICD-10-CM | POA: Diagnosis not present

## 2021-04-07 DIAGNOSIS — G4733 Obstructive sleep apnea (adult) (pediatric): Secondary | ICD-10-CM | POA: Diagnosis not present

## 2021-04-16 DIAGNOSIS — G4733 Obstructive sleep apnea (adult) (pediatric): Secondary | ICD-10-CM | POA: Diagnosis not present

## 2021-06-25 DIAGNOSIS — L989 Disorder of the skin and subcutaneous tissue, unspecified: Secondary | ICD-10-CM | POA: Diagnosis not present

## 2021-06-26 DIAGNOSIS — B354 Tinea corporis: Secondary | ICD-10-CM | POA: Diagnosis not present

## 2021-06-26 DIAGNOSIS — Z23 Encounter for immunization: Secondary | ICD-10-CM | POA: Diagnosis not present

## 2021-08-06 ENCOUNTER — Other Ambulatory Visit: Payer: Self-pay | Admitting: Family Medicine

## 2021-08-06 DIAGNOSIS — R921 Mammographic calcification found on diagnostic imaging of breast: Secondary | ICD-10-CM

## 2021-08-24 DIAGNOSIS — Z961 Presence of intraocular lens: Secondary | ICD-10-CM | POA: Diagnosis not present

## 2021-08-24 DIAGNOSIS — H524 Presbyopia: Secondary | ICD-10-CM | POA: Diagnosis not present

## 2021-08-26 DIAGNOSIS — I10 Essential (primary) hypertension: Secondary | ICD-10-CM | POA: Diagnosis not present

## 2021-08-26 DIAGNOSIS — R7303 Prediabetes: Secondary | ICD-10-CM | POA: Diagnosis not present

## 2021-08-26 DIAGNOSIS — G4733 Obstructive sleep apnea (adult) (pediatric): Secondary | ICD-10-CM | POA: Diagnosis not present

## 2021-08-26 DIAGNOSIS — E7800 Pure hypercholesterolemia, unspecified: Secondary | ICD-10-CM | POA: Diagnosis not present

## 2021-10-08 ENCOUNTER — Ambulatory Visit
Admission: RE | Admit: 2021-10-08 | Discharge: 2021-10-08 | Disposition: A | Discharge status: Home / Self Care | Payer: PPO | Source: Ambulatory Visit | Attending: Family Medicine | Admitting: Family Medicine

## 2021-10-08 DIAGNOSIS — L814 Other melanin hyperpigmentation: Secondary | ICD-10-CM | POA: Diagnosis not present

## 2021-10-08 DIAGNOSIS — L570 Actinic keratosis: Secondary | ICD-10-CM | POA: Diagnosis not present

## 2021-10-08 DIAGNOSIS — L578 Other skin changes due to chronic exposure to nonionizing radiation: Secondary | ICD-10-CM | POA: Diagnosis not present

## 2021-10-08 DIAGNOSIS — D225 Melanocytic nevi of trunk: Secondary | ICD-10-CM | POA: Diagnosis not present

## 2021-10-08 DIAGNOSIS — R921 Mammographic calcification found on diagnostic imaging of breast: Secondary | ICD-10-CM | POA: Diagnosis not present

## 2021-10-08 DIAGNOSIS — D485 Neoplasm of uncertain behavior of skin: Secondary | ICD-10-CM | POA: Diagnosis not present

## 2021-10-08 DIAGNOSIS — C44629 Squamous cell carcinoma of skin of left upper limb, including shoulder: Secondary | ICD-10-CM | POA: Diagnosis not present

## 2021-10-08 DIAGNOSIS — L821 Other seborrheic keratosis: Secondary | ICD-10-CM | POA: Diagnosis not present

## 2021-10-08 DIAGNOSIS — Z85828 Personal history of other malignant neoplasm of skin: Secondary | ICD-10-CM | POA: Diagnosis not present

## 2021-10-16 ENCOUNTER — Encounter: Payer: Self-pay | Admitting: Cardiovascular Disease

## 2021-10-16 ENCOUNTER — Ambulatory Visit (INDEPENDENT_AMBULATORY_CARE_PROVIDER_SITE_OTHER): Payer: PPO | Admitting: Cardiovascular Disease

## 2021-10-16 VITALS — BP 118/62 | HR 59 | Ht 63.0 in | Wt 159.2 lb

## 2021-10-16 DIAGNOSIS — I10 Essential (primary) hypertension: Secondary | ICD-10-CM | POA: Diagnosis not present

## 2021-10-16 DIAGNOSIS — R072 Precordial pain: Secondary | ICD-10-CM

## 2021-10-16 DIAGNOSIS — I208 Other forms of angina pectoris: Secondary | ICD-10-CM | POA: Diagnosis not present

## 2021-10-16 DIAGNOSIS — E782 Mixed hyperlipidemia: Secondary | ICD-10-CM

## 2021-10-16 MED ORDER — METOPROLOL TARTRATE 25 MG PO TABS: 25.0000 mg | ORAL_TABLET | Freq: Once | ORAL | 0 refills | Status: DC

## 2021-10-16 NOTE — Assessment & Plan Note (Signed)
Amber Sloan has complained of chest pain occurring on a monthly basis over the last 2 years.  She did have a negative Myoview stress test back in 2015 for chest pain which was negative.  A cholecystectomy assessment performed resulted in complete resolution of her symptoms.  She does have a strong family history of heart disease with parents and siblings all who have had myocardial infarctions.  I am going to get a coronary CTA to further evaluate. ?

## 2021-10-16 NOTE — Assessment & Plan Note (Signed)
History of essential hypertension a blood pressure measured today at 118/62.  She is on Lotensin, and hydrochlorothiazide. ?

## 2021-10-16 NOTE — Patient Instructions (Addendum)
Medication Instructions:  ?Your physician recommends that you continue on your current medications as directed. Please refer to the Current Medication list given to you today. ? ?*If you need a refill on your cardiac medications before your next appointment, please call your pharmacy* ? ? ?Lab Work: ?Your physician recommends that you labs drawn today: BMET ? ?If you have labs (blood work) drawn today and your tests are completely normal, you will receive your results only by: ?MyChart Message (if you have MyChart) OR ?A paper copy in the mail ?If you have any lab test that is abnormal or we need to change your treatment, we will call you to review the results. ? ? ?Testing/Procedures: ?See below ? ? ?Follow-Up: ?At [facility], you and your health needs are our priority.  As part of our continuing mission to provide you with exceptional heart care, we have created designated Provider Care Teams.  These Care Teams include your primary Cardiologist (physician) and Advanced Practice Providers (APPs -  Physician Assistants and Nurse Practitioners) who all work together to provide you with the care you need, when you need it. ? ?We recommend signing up for the patient portal called "MyChart".  Sign up information is provided on this After Visit Summary.  MyChart is used to connect with patients for Virtual Visits (Telemedicine).  Patients are able to view lab/test results, encounter notes, upcoming appointments, etc.  Non-urgent messages can be sent to your provider as well.   ?To learn more about what you can do with MyChart, go to [email or web address].   ? ?Your next appointment:   ?6 month(s) ? ?The format for your next appointment:   ?In Person ? ?Provider:   ?Coletta Memos, FNP, Fabian Sharp, PA-C, Sande Rives, PA-C, Caron Presume, PA-C, Jory Sims, DNP, ANP, or Almyra Deforest, PA-C     ? ?Then, Quay Burow, MD will plan to see you again in 12 month(s).  ? ? ?Other Instructions ? ? ?Your cardiac CT  will be scheduled at the below location:  ? ?[facility] ?[address] ?[place], [place] [address] ?[phone] ? ? ?If scheduled at [facility], please arrive at the [facility] Entrance (Entrance C2) of [facility] 30 minutes prior to test start time. ?You can use the FREE valet parking offered at entrance C (encouraged to control the heart rate for the test)  ?Proceed to the [facility] Radiology Department (first floor) to check-in and test prep. ? ?All radiology patients and guests should use entrance C2 at [facility], accessed from [facility], even though the hospital's physical address listed is [address] ? ? ? ?If scheduled at [facility], please arrive 15 mins early for check-in and test prep. ? ?Please follow these instructions carefully (unless otherwise directed): ? ? ?On the Night Before the Test: ?Be sure to Drink plenty of water. ?Do not consume any caffeinated/decaffeinated beverages or chocolate 12 hours prior to your test. ?Do not take any antihistamines 12 hours prior to your test. ? ? ?On the Day of the Test: ?Drink plenty of water until 1 hour prior to the test. ?Do not eat any food 4 hours prior to the test. ?You may take your regular medications prior to the test.  ?Take metoprolol (Lopressor) two hours prior to test. ?HOLD Furosemide/Hydrochlorothiazide morning of the test. ?FEMALES- please wear underwire-free bra if available, avoid dresses & tight clothing ? ?     ?After the  Test: ?Drink plenty of water. ?After receiving IV contrast, you may experience a mild flushed feeling. This is normal. ?On occasion, you may experience a mild rash up to 24 hours after the test. This is not dangerous. If this occurs, you can take Benadryl 25 mg and increase your fluid intake. ?If you experience trouble breathing, this can be serious. If it is severe call 911 IMMEDIATELY. If it is mild, please call  our office. ?If you take any of these medications: Glipizide/Metformin, Avandament, Glucavance, please do not take 48 hours after completing test unless otherwise instructed. ? ?We will call to schedule your test 2-4 weeks out understanding that some insurance companies will need an authorization prior to the service being performed.  ? ?For non-scheduling related questions, please contact the cardiac imaging nurse navigator should you have any questions/concerns: ?Marchia Bond, Cardiac Imaging Nurse Navigator ?Gordy Clement, Cardiac Imaging Nurse Navigator ?[facility] ?Direct Office Dial: [phone]  ? ?For scheduling needs, including cancellations and rescheduling, please call [place], [phone]. ?

## 2021-10-16 NOTE — Assessment & Plan Note (Signed)
History of hyperlipidemia on statin therapy with lipid profile performed 08/26/2021 revealing total cholesterol 151, LDL 72 and HDL 56. ?

## 2021-10-16 NOTE — Progress Notes (Signed)
? ? ? ?10/16/2021 ?Amber Sloan   ?22-Oct-1940  ?[id number] ? ?Primary Physician Kelton Pillar, MD ?Primary Cardiologist: Lorretta Harp MD Amber Sloan, [name] ? ?HPI:  Amber Sloan is a 81 y.o.  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 She was referred by Dr. Kelton Pillar for cardiovascular evaluation because of new-onset angina. I last saw her in the office 06/01/2016. Her cardiac risk factors include treated hypertension, and hyperlipidemia. She has a strong family history of heart disease with both parents had myocardial infarctions, brother who died at age 20 of a myocardial infarction at 15 other brothers who have CAD. When I saw her a year ago she had had new onset chest pain. A 2-D echo Myoview stress test were normal after that. In November of last year she underwent cholecystectomy and her symptoms completely resolved. Since I saw her a year ago she's been completely symptomatic. ? ?Since I saw her 5 years ago she continues to do well.  She has lost 14 pounds and has come off CPAP.  She developed substernal chest pain approximate 2 years ago occurring on a monthly basis lasting for minutes at a time.  It is not necessarily brought on by activity. ? ? ?Current Meds  ?Medication Sig  ? acetaminophen (TYLENOL) 650 MG CR tablet Take 1,300 mg by mouth at bedtime.  ? aspirin 81 MG EC tablet 1 tablet  ? benazepril-hydrochlorthiazide (LOTENSIN HCT) 10-12.5 MG per tablet Take 1 tablet by mouth every morning.  ? Flaxseed, Linseed, (FLAX SEEDS PO) Take 1,200 mg by mouth daily.  ? GARLIC OIL PO Take 1 capsule by mouth daily.  ? Magnesium 500 MG CAPS Take 1 capsule by mouth daily.  ? Multiple Vitamin (MULTIVITAMIN WITH MINERALS) TABS tablet Take 1 tablet by mouth daily.  ? polyethylene glycol (MIRALAX / GLYCOLAX) packet Take 17 g by mouth daily.  ? raloxifene (EVISTA) 60 MG tablet Take 60 mg by mouth  daily.  ? simvastatin (ZOCOR) 20 MG tablet Take 20 mg by mouth daily.  ?  ? ?Allergies  ?Allergen Reactions  ? Parafon Forte Dsc [Chlorzoxazone] Other (See Comments)  ?  Old allergy unknown reaction.   ? Sulfa Antibiotics Nausea And Vomiting  ?  Drugs  ? ? ?Social History  ? ?Socioeconomic History  ? Marital status: Married  ?  Spouse name: Not on file  ? Number of children: Not on file  ? Years of education: Not on file  ? Highest education level: Not on file  ?Occupational History  ? Not on file  ?Tobacco Use  ? Smoking status: Never  ? Smokeless tobacco: Never  ?Substance and Sexual Activity  ? Alcohol use: No  ? Drug use: No  ? Sexual activity: Not on file  ?Other Topics Concern  ? Not on file  ?Social History Narrative  ? Not on file  ? ?Social Determinants of Health  ? ?Financial Resource Strain: Not on file  ?Food Insecurity: Not on file  ?Transportation Needs: Not on file  ?Physical Activity: Not on file  ?Stress: Not on file  ?Social Connections: Not on file  ?Intimate Partner Violence: Not on file  ?  ? ?Review of Systems: ?General: negative for chills, fever, night sweats or weight changes.  ?Cardiovascular: negative for chest pain, dyspnea on exertion, edema, orthopnea, palpitations, paroxysmal nocturnal dyspnea or shortness of breath ?Dermatological: negative for rash ?Respiratory: negative for cough or wheezing ?  Urologic: negative for hematuria ?Abdominal: negative for nausea, vomiting, diarrhea, bright red blood per rectum, melena, or hematemesis ?Neurologic: negative for visual changes, syncope, or dizziness ?All other systems reviewed and are otherwise negative except as noted above. ? ? ? ?Blood pressure 118/62, pulse (!) 59, height '5\' 3"'$  (1.6 m), weight 159 lb 3.2 oz (72.2 kg), SpO2 97 %.  ?General appearance: alert and no distress ?Neck: no adenopathy, no carotid bruit, no JVD, supple, symmetrical, trachea midline, and thyroid not enlarged, symmetric, no tenderness/mass/nodules ?Lungs: clear to  auscultation bilaterally ?Heart: regular rate and rhythm, S1, S2 normal, no murmur, click, rub or gallop ?Extremities: extremities normal, atraumatic, no cyanosis or edema ?Pulses: 2+ and symmetric ?Skin: Skin color, texture, turgor normal. No rashes or lesions ?Neurologic: Grossly normal ? ?EKG sinus bradycardia 59 without ST or T wave changes.  I personally reviewed this EKG. ? ?ASSESSMENT AND PLAN:  ? ?Chest pain ?Ms. Knisely has complained of chest pain occurring on a monthly basis over the last 2 years.  She did have a negative Myoview stress test back in 2015 for chest pain which was negative.  A cholecystectomy assessment performed resulted in complete resolution of her symptoms.  She does have a strong family history of heart disease with parents and siblings all who have had myocardial infarctions.  I am going to get a coronary CTA to further evaluate. ? ?Essential hypertension ?History of essential hypertension a blood pressure measured today at 118/62.  She is on Lotensin, and hydrochlorothiazide. ? ?Hyperlipidemia ?History of hyperlipidemia on statin therapy with lipid profile performed 08/26/2021 revealing total cholesterol 151, LDL 72 and HDL 56. ? ? ? ? ?Lorretta Harp MD FACP,FACC,FAHA, FSCAI ?10/16/2021 ?10:49 AM ?

## 2021-10-17 LAB — BASIC METABOLIC PANEL
BUN/Creatinine Ratio: 14 (ref 12–28)
BUN: 10 mg/dL (ref 8–27)
CO2: 28 mmol/L (ref 20–29)
Calcium: 9.8 mg/dL (ref 8.7–10.3)
Chloride: 98 mmol/L (ref 96–106)
Creatinine, Ser: 0.73 mg/dL (ref 0.57–1.00)
Glucose: 99 mg/dL (ref 70–99)
Potassium: 4.4 mmol/L (ref 3.5–5.2)
Sodium: 140 mmol/L (ref 134–144)
eGFR: 84 mL/min/{1.73_m2} (ref >59)

## 2021-10-30 ENCOUNTER — Telehealth (HOSPITAL_COMMUNITY): Payer: Self-pay | Admitting: *Deleted

## 2021-10-30 NOTE — Telephone Encounter (Signed)
Reaching out to patient to offer assistance regarding upcoming cardiac imaging study; pt verbalizes understanding of appt date/time, parking situation and where to check in, pre-test NPO status and medications ordered, and verified current allergies; name and call back number provided for further questions should they arise ? ?Gordy Clement RN Navigator Cardiac Imaging ?[facility] ?[phone] office ?[phone] cell ? ?Patient to take '25mg'$  metoprolol tartrate two hours prior to cardiac CT scan. She states her HR is normally around 72bpm. She is aware to arrive at 7:15am. ?

## 2021-11-02 ENCOUNTER — Ambulatory Visit (HOSPITAL_COMMUNITY)
Admission: RE | Admit: 2021-11-02 | Discharge: 2021-11-02 | Disposition: A | Discharge status: Home / Self Care | Payer: PPO | Source: Ambulatory Visit | Attending: Cardiovascular Disease | Admitting: Cardiovascular Disease

## 2021-11-02 DIAGNOSIS — R072 Precordial pain: Secondary | ICD-10-CM | POA: Diagnosis not present

## 2021-11-02 MED ORDER — NITROGLYCERIN 0.4 MG SL SUBL
0.8000 mg | SUBLINGUAL_TABLET | Freq: Once | SUBLINGUAL | Status: AC
  Administered 2021-11-02: 0.8 mg via SUBLINGUAL

## 2021-11-02 MED ORDER — IOHEXOL 350 MG/ML SOLN
100.0000 mL | Freq: Once | INTRAVENOUS | Status: AC | PRN
  Administered 2021-11-02: 100 mL via INTRAVENOUS

## 2021-11-02 MED ORDER — NITROGLYCERIN 0.4 MG SL SUBL
SUBLINGUAL_TABLET | SUBLINGUAL | Status: AC
  Filled 2021-11-02: qty 2

## 2021-11-05 ENCOUNTER — Encounter: Payer: Self-pay | Admitting: Cardiovascular Disease

## 2021-11-05 ENCOUNTER — Telehealth: Payer: Self-pay | Admitting: Cardiovascular Disease

## 2021-11-05 NOTE — Telephone Encounter (Signed)
Patient would like to go over her results of her CT scan.  ?

## 2021-11-05 NOTE — Telephone Encounter (Signed)
Attempted to call patient, left message for patient to call back to office.   

## 2021-11-09 NOTE — Telephone Encounter (Signed)
Left message for pt to call.

## 2021-11-13 NOTE — Telephone Encounter (Signed)
Left message for pt to call.

## 2021-12-02 DIAGNOSIS — R3915 Urgency of urination: Secondary | ICD-10-CM | POA: Diagnosis not present

## 2021-12-02 DIAGNOSIS — N3090 Cystitis, unspecified without hematuria: Secondary | ICD-10-CM | POA: Diagnosis not present

## 2021-12-02 DIAGNOSIS — R109 Unspecified abdominal pain: Secondary | ICD-10-CM | POA: Diagnosis not present

## 2021-12-03 DIAGNOSIS — C44629 Squamous cell carcinoma of skin of left upper limb, including shoulder: Secondary | ICD-10-CM | POA: Diagnosis not present

## 2022-01-08 DIAGNOSIS — D3612 Benign neoplasm of peripheral nerves and autonomic nervous system, upper limb, including shoulder: Secondary | ICD-10-CM | POA: Diagnosis not present

## 2022-01-08 DIAGNOSIS — D485 Neoplasm of uncertain behavior of skin: Secondary | ICD-10-CM | POA: Diagnosis not present

## 2022-01-08 DIAGNOSIS — L570 Actinic keratosis: Secondary | ICD-10-CM | POA: Diagnosis not present

## 2022-03-05 ENCOUNTER — Other Ambulatory Visit: Payer: Self-pay | Admitting: Internal Medicine

## 2022-03-05 DIAGNOSIS — M8588 Other specified disorders of bone density and structure, other site: Secondary | ICD-10-CM | POA: Diagnosis not present

## 2022-03-05 DIAGNOSIS — R195 Other fecal abnormalities: Secondary | ICD-10-CM | POA: Diagnosis not present

## 2022-03-05 DIAGNOSIS — R8290 Unspecified abnormal findings in urine: Secondary | ICD-10-CM | POA: Diagnosis not present

## 2022-03-05 DIAGNOSIS — Z0000 Encounter for general adult medical examination without abnormal findings: Secondary | ICD-10-CM | POA: Diagnosis not present

## 2022-03-05 DIAGNOSIS — Z23 Encounter for immunization: Secondary | ICD-10-CM | POA: Diagnosis not present

## 2022-03-05 DIAGNOSIS — E7800 Pure hypercholesterolemia, unspecified: Secondary | ICD-10-CM | POA: Diagnosis not present

## 2022-03-05 DIAGNOSIS — I10 Essential (primary) hypertension: Secondary | ICD-10-CM | POA: Diagnosis not present

## 2022-03-05 DIAGNOSIS — R7303 Prediabetes: Secondary | ICD-10-CM | POA: Diagnosis not present

## 2022-03-05 DIAGNOSIS — Z13820 Encounter for screening for osteoporosis: Secondary | ICD-10-CM | POA: Diagnosis not present

## 2022-03-05 DIAGNOSIS — G4733 Obstructive sleep apnea (adult) (pediatric): Secondary | ICD-10-CM | POA: Diagnosis not present

## 2022-03-08 DIAGNOSIS — R195 Other fecal abnormalities: Secondary | ICD-10-CM | POA: Diagnosis not present

## 2022-03-08 DIAGNOSIS — Z0000 Encounter for general adult medical examination without abnormal findings: Secondary | ICD-10-CM | POA: Diagnosis not present

## 2022-03-17 DIAGNOSIS — R197 Diarrhea, unspecified: Secondary | ICD-10-CM | POA: Diagnosis not present

## 2022-03-17 DIAGNOSIS — R194 Change in bowel habit: Secondary | ICD-10-CM | POA: Diagnosis not present

## 2022-03-24 ENCOUNTER — Telehealth: Payer: Self-pay | Admitting: Cardiovascular Disease

## 2022-03-24 NOTE — Telephone Encounter (Signed)
   Pre-operative Risk Assessment    Patient Name: Amber Sloan  DOB: [birth year] MRN: [id number]      Request for Surgical Clearance    Procedure:   Colonoscopy   Date of Surgery:  Clearance 04/05/22                                 Surgeon:  Dr.Brahmbhatt Surgeon's Group or Practice Name:  [facility] Phone number:  [phone] Fax number:  [phone]   Type of Clearance Requested:   - Medical    Type of Anesthesia:  Local    Additional requests/questions:      SignedMilbert Coulter   03/24/2022, 10:28 AM

## 2022-03-24 NOTE — Telephone Encounter (Signed)
   Name: Amber Sloan  DOB: 03-15-1941  MRN: [id number]  Primary Cardiologist: None   Preoperative team, please contact this patient and set up a phone call appointment for further preoperative risk assessment. Please obtain consent and complete medication review. Thank you for your help.  I confirm that guidance regarding antiplatelet and oral anticoagulation therapy has been completed and, if necessary, noted below (none requested).    Lenna Sciara, NP 03/24/2022, 12:51 PM [place]

## 2022-03-25 ENCOUNTER — Telehealth: Payer: Self-pay

## 2022-03-25 NOTE — Telephone Encounter (Signed)
  Patient Consent for Virtual Visit        Amber Sloan has provided verbal consent on 03/25/2022 for a virtual visit (video or telephone).   CONSENT FOR VIRTUAL VISIT FOR:  Amber Sloan  By participating in this virtual visit I agree to the following:  I hereby voluntarily request, consent and authorize [facility] and its employed or contracted physicians, physician assistants, nurse practitioners or other licensed health care professionals (the Practitioner), to provide me with telemedicine health care services (the "Services") as deemed necessary by the treating Practitioner. I acknowledge and consent to receive the Services by the Practitioner via telemedicine. I understand that the telemedicine visit will involve communicating with the Practitioner through live audiovisual communication technology and the disclosure of certain medical information by electronic transmission. I acknowledge that I have been given the opportunity to request an in-person assessment or other available alternative prior to the telemedicine visit and am voluntarily participating in the telemedicine visit.  I understand that I have the right to withhold or withdraw my consent to the use of telemedicine in the course of my care at any time, without affecting my right to future care or treatment, and that the Practitioner or I may terminate the telemedicine visit at any time. I understand that I have the right to inspect all information obtained and/or recorded in the course of the telemedicine visit and may receive copies of available information for a reasonable fee.  I understand that some of the potential risks of receiving the Services via telemedicine include:  Delay or interruption in medical evaluation due to technological equipment failure or disruption; Information transmitted may not be sufficient (e.g. poor resolution of images) to allow for appropriate medical decision making by  the Practitioner; and/or  In rare instances, security protocols could fail, causing a breach of personal health information.  Furthermore, I acknowledge that it is my responsibility to provide information about my medical history, conditions and care that is complete and accurate to the best of my ability. I acknowledge that Practitioner's advice, recommendations, and/or decision may be based on factors not within their control, such as incomplete or inaccurate data provided by me or distortions of diagnostic images or specimens that may result from electronic transmissions. I understand that the practice of medicine is not an exact science and that Practitioner makes no warranties or guarantees regarding treatment outcomes. I acknowledge that a copy of this consent can be made available to me via my patient portal ([facility]), or I can request a printed copy by calling the office of [facility].    I understand that my insurance will be billed for this visit.   I have read or had this consent read to me. I understand the contents of this consent, which adequately explains the benefits and risks of the Services being provided via telemedicine.  I have been provided ample opportunity to ask questions regarding this consent and the Services and have had my questions answered to my satisfaction. I give my informed consent for the services to be provided through the use of telemedicine in my medical care

## 2022-03-25 NOTE — Telephone Encounter (Signed)
Spoke with patient who is agreeable to do a virtual visit on 10/3 @ 2 pm. Med rec and consent have been done.

## 2022-03-30 ENCOUNTER — Encounter: Payer: Self-pay | Admitting: Nurse Practitioner

## 2022-03-30 ENCOUNTER — Ambulatory Visit: Payer: PPO | Attending: Cardiology | Admitting: Nurse Practitioner

## 2022-03-30 DIAGNOSIS — Z01810 Encounter for preprocedural cardiovascular examination: Secondary | ICD-10-CM

## 2022-03-30 NOTE — Progress Notes (Signed)
Virtual Visit via Telephone Note   Because of Amber Sloan's co-morbid illnesses, she is at least at moderate risk for complications without adequate follow up.  This format is felt to be most appropriate for this patient at this time.  The patient did not have access to video technology/had technical difficulties with video requiring transitioning to audio format only (telephone).  All issues noted in this document were discussed and addressed.  No physical exam could be performed with this format.  Please refer to the patient's chart for her consent to telehealth for [facility].  Evaluation Performed:  Preoperative cardiovascular risk assessment _____________   Date:  03/30/2022   Patient ID:  Amber Sloan, DOB Jan 06, 1941, MRN [id number] Patient Location:  Home Provider location:   Office  Primary Care Provider:  Kelton Pillar, MD Primary Cardiologist:  Quay Burow, MD  Chief Complaint / Patient Profile   81 y.o. y/o female with a h/o hypertension, hyperlipidemia, chest pain with negative nuclear stress test 2015, coronary CTA 11/02/2021 which revealed coronary calcium score of 215 with mild plaque noted in LAD and minimal plaque in LCx and RCA who is pending colonoscopy and presents today for telephonic preoperative cardiovascular risk assessment.  Past Medical History    Past Medical History:  Diagnosis Date   Anginal pain ([place])    had cardiac work up with Dr Gwenlyn Found, had a normal Stress test.   Arthritis    Breast mass, right    Hx of (Dr. Criss Rosales)   Chest pain    Constipation    Diverticulitis    Dysrhythmia    Environmental allergies    Family history of heart disease    Hyperlipidemia    pure, continue on Simvastatin   Hypertension    benign, uncontrolled, continue on Benazepril-Hydrochlorothiazide   Menopause    Obesity, unspecified    Osteopenia    on Evista since 2007   Peripheral neuropathy    left   PONV (postoperative  nausea and vomiting)    Shortness of breath dyspnea    when walking upastairs    Sleep apnea    on cpap   Squamous cell carcinoma of face    Past Surgical History:  Procedure Laterality Date   ABDOMINAL HYSTERECTOMY  1986   still has Ovaries   ABDOMINAL HYSTERECTOMY     BACK SURGERY     BREAST EXCISIONAL BIOPSY Right    BREAST FIBROADENOMA SURGERY  03/1997   Right breast   CHOLECYSTECTOMY N/A 05/16/2014   Procedure: LAPAROSCOPIC CHOLECYSTECTOMY WITH INTRAOPERATIVE CHOLANGIOGRAM;  Surgeon: [name], MD;  Location: [facility];  Service: General;  Laterality: N/A;   COLONOSCOPY     EVALUATION UNDER ANESTHESIA WITH STRABISMUS REPAIR     LUMBAR FUSION  10/2005   5,    TONSILLECTOMY     TONSILLECTOMY AND ADENOIDECTOMY      Allergies  Allergies  Allergen Reactions   Parafon Forte Dsc [Chlorzoxazone] Other (See Comments)    Old allergy unknown reaction.    Sulfa Antibiotics Nausea And Vomiting    Drugs    History of Present Illness    Amber Sloan is a 81 y.o. female who presents via audio/video conferencing for a telehealth visit today.  Pt was last seen in cardiology clinic on 10/16/21 by Dr. Gwenlyn Found.  At that time Amber Sloan was doing well.  The patient is now pending procedure as outlined above. Since her last visit, she denies shortness of breath, lower  extremity edema, fatigue, palpitations, melena, hematuria, hemoptysis, diaphoresis, weakness, presyncope, syncope, orthopnea, and PND. States symptoms of chest pain continue to occur 2-3 times per week, does not worsen with exertion. Also has back and leg pain. Had coronary CTA 11/02/21 which did not reveal obstructive CAD. She has a follow-up appointment with Caron Presume, PA on 10/16 which I encouraged her to keep.   Home Medications    Prior to Admission medications   Medication Sig Start Date End Date Taking? Authorizing Provider  acetaminophen (TYLENOL) 650 MG CR tablet Take 1,300 mg by mouth at  bedtime.    [provider]  aspirin 81 MG EC tablet 1 tablet    [provider]  benazepril-hydrochlorthiazide (LOTENSIN HCT) 10-12.5 MG per tablet Take 1 tablet by mouth every morning. 09/17/13   [provider]  Flaxseed, Linseed, (FLAX SEEDS PO) Take 1,200 mg by mouth daily.    [provider]  GARLIC OIL PO Take 1 capsule by mouth daily.    [provider]  Magnesium 500 MG CAPS Take 1 capsule by mouth daily.    [provider]  metoprolol tartrate (LOPRESSOR) 25 MG tablet Take 1 tablet (25 mg total) by mouth once for 1 dose. Take 2 hours prior to procedure. 10/16/21 10/16/21  Lorretta Harp, MD  Multiple Vitamin (MULTIVITAMIN WITH MINERALS) TABS tablet Take 1 tablet by mouth daily.    [provider]  polyethylene glycol (MIRALAX / GLYCOLAX) packet Take 17 g by mouth daily.    [provider]  raloxifene (EVISTA) 60 MG tablet Take 60 mg by mouth daily.    [provider]  simvastatin (ZOCOR) 20 MG tablet Take 20 mg by mouth daily.    [provider]    Physical Exam    Vital Signs:  Amber Sloan does not have vital signs available for review today.  Given telephonic nature of communication, physical exam is limited. AAOx3. NAD. Normal affect.  Speech and respirations are unlabored.  Accessory Clinical Findings    None  Assessment & Plan    1.  Preoperative Cardiovascular Risk Assessment: The patient is doing well from a cardiac perspective. Therefore, based on ACC/AHA guidelines, the patient would be at acceptable risk for the planned procedure without further cardiovascular testing.  According to the Revised Cardiac Risk Index (RCRI), her Perioperative Risk of Major Cardiac Event is (%): 0.4. Her Functional Capacity in METs is: 5.07 according to the [facility] Activity Status Index (DASI).  The patient was advised that if she develops new symptoms prior to surgery to contact our office  to arrange for a follow-up visit, and she verbalized understanding.  No request to hold cardiac medications.  A copy of this note will be routed to requesting surgeon.  Time:   Today, I have spent 8 minutes with the patient with telehealth technology discussing medical history, symptoms, and management plan.     Emmaline Life, NP-C  03/30/2022, 2:01 PM [address], [address] Office [phone] Fax [phone]

## 2022-04-05 DIAGNOSIS — K52832 Lymphocytic colitis: Secondary | ICD-10-CM | POA: Diagnosis not present

## 2022-04-05 DIAGNOSIS — K644 Residual hemorrhoidal skin tags: Secondary | ICD-10-CM | POA: Diagnosis not present

## 2022-04-05 DIAGNOSIS — K5730 Diverticulosis of large intestine without perforation or abscess without bleeding: Secondary | ICD-10-CM | POA: Diagnosis not present

## 2022-04-05 DIAGNOSIS — R194 Change in bowel habit: Secondary | ICD-10-CM | POA: Diagnosis not present

## 2022-04-05 DIAGNOSIS — K648 Other hemorrhoids: Secondary | ICD-10-CM | POA: Diagnosis not present

## 2022-04-07 DIAGNOSIS — K52832 Lymphocytic colitis: Secondary | ICD-10-CM | POA: Diagnosis not present

## 2022-04-12 DIAGNOSIS — L570 Actinic keratosis: Secondary | ICD-10-CM | POA: Diagnosis not present

## 2022-04-13 NOTE — Progress Notes (Unsigned)
Cardiology Office Note:    Date:  04/14/2022   ID:  Amber Sloan, DOB Mar 06, 1941, MRN [id number]  PCP:  Kelton Pillar, [place] Cardiologist: Quay Burow, MD   Reason for visit: 62-monthfollow-up  History of Present Illness:    Amber Sloan a 81y.o. female with a hx of hypertension, hyperlipidemia, strong family history of heart disease and intermittent chest pain.  She last saw Dr. BGwenlyn Foundin April 2023 mentioned substernal chest pain approximately once a month, not purely exertional for the past 2 years.  CTA of the coronaries showed minimal to mild disease, coronary calcium score 215 which was 68th percentile.  She had a telephone preop clearance on October 3 for a colonoscopy.  She mentions symptoms of chest pain 2-3 times per week.  Today, patient states colonoscopy showed colitis.  She has a follow-up with GI November 9 to discuss management.  She has issues with abdominal pain and watery diarrhea.  From a heart standpoint, she describes her chest pain episodes as a grabbing central chest pain, nonradiating associated with lightheadedness.  It can occur 1 or 2 times a week to once every other week; chest pain can last about 15 minutes and lightheadedness can last an hour.  Episodes without known trigger.  Not related to exertion or eating.  She denies palpitations, PND, orthopnea and LE edema.  No syncope.  Taking aspirin without bleeding issues.  She notes recent weight loss with improved diet after she was labeled prediabetic.  She notes diet restrictions with history of diverticulitis and gallbladder disease.  She wonders if she has GERD with coughing when she lays down at night.     Past Medical History:  Diagnosis Date   Anginal pain ([place]    had cardiac work up with Dr BGwenlyn Found had a normal Stress test.   Arthritis    Breast mass, right    Hx of (Dr. BCriss Rosales   Chest pain    Constipation    Diverticulitis    Dysrhythmia     Environmental allergies    Family history of heart disease    Hyperlipidemia    pure, continue on Simvastatin   Hypertension    benign, uncontrolled, continue on Benazepril-Hydrochlorothiazide   Menopause    Obesity, unspecified    Osteopenia    on Evista since 2007   Peripheral neuropathy    left   PONV (postoperative nausea and vomiting)    Shortness of breath dyspnea    when walking upastairs    Sleep apnea    on cpap   Squamous cell carcinoma of face     Past Surgical History:  Procedure Laterality Date   ABDOMINAL HYSTERECTOMY  1986   still has Ovaries   ABDOMINAL HYSTERECTOMY     BACK SURGERY     BREAST EXCISIONAL BIOPSY Right    BREAST FIBROADENOMA SURGERY  03/1997   Right breast   CHOLECYSTECTOMY N/A 05/16/2014   Procedure: LAPAROSCOPIC CHOLECYSTECTOMY WITH INTRAOPERATIVE CHOLANGIOGRAM;  Surgeon: TJackolyn Confer MD;  Location: [facility]  Service: General;  Laterality: N/A;   COLONOSCOPY     EVALUATION UNDER ANESTHESIA WITH STRABISMUS REPAIR     LUMBAR FUSION  10/2005   5,    TONSILLECTOMY     TONSILLECTOMY AND ADENOIDECTOMY      Current Medications: Current Meds  Medication Sig   acetaminophen (TYLENOL) 650 MG CR tablet Take 1,300 mg by mouth at bedtime.   aspirin 81 MG EC tablet 1  tablet   benazepril-hydrochlorthiazide (LOTENSIN HCT) 10-12.5 MG per tablet Take 1 tablet by mouth every morning.   Flaxseed, Linseed, (FLAX SEEDS PO) Take 1,200 mg by mouth daily.   GARLIC OIL PO Take 1 capsule by mouth daily.   Magnesium 500 MG CAPS Take 1 capsule by mouth daily.   Multiple Vitamin (MULTIVITAMIN WITH MINERALS) TABS tablet Take 1 tablet by mouth daily.   polyethylene glycol (MIRALAX / GLYCOLAX) packet Take 17 g by mouth daily.   raloxifene (EVISTA) 60 MG tablet Take 60 mg by mouth daily.   simvastatin (ZOCOR) 20 MG tablet Take 20 mg by mouth daily.     Allergies:   Parafon forte dsc [chlorzoxazone] and Sulfa antibiotics   Social History   Socioeconomic  History   Marital status: Married    Spouse name: Not on file   Number of children: Not on file   Years of education: Not on file   Highest education level: Not on file  Occupational History   Not on file  Tobacco Use   Smoking status: Never   Smokeless tobacco: Never  Substance and Sexual Activity   Alcohol use: No   Drug use: No   Sexual activity: Not on file  Other Topics Concern   Not on file  Social History Narrative   Not on file   Social Determinants of Health   Financial Resource Strain: Not on file  Food Insecurity: Not on file  Transportation Needs: Not on file  Physical Activity: Not on file  Stress: Not on file  Social Connections: Not on file     Family History: The patient's family history includes CVA in her brother, brother, and brother; Coronary artery disease in her father and mother; Diabetes Mellitus II in her brother, brother, and brother; Hyperlipidemia in her brother, brother, and brother; Hypertension in her brother, brother, brother, father, and mother.  ROS:   Please see the history of present illness.     EKGs/Labs/Other Studies Reviewed:    Recent Labs: 10/16/2021: BUN 10; Creatinine, Ser 0.73; Potassium 4.4; Sodium 140   Recent Lipid Panel No results found for: "CHOL", "TRIG", "HDL", "LDLCALC", "LDLDIRECT"  Physical Exam:    VS:  BP 132/84   Pulse 65   Ht '5\' 4"'$  (1.626 m)   Wt 153 lb (69.4 kg)   BMI 26.26 kg/m    No data found.       Wt Readings from Last 3 Encounters:  04/14/22 153 lb (69.4 kg)  10/16/21 159 lb 3.2 oz (72.2 kg)  06/01/16 173 lb (78.5 kg)     GEN:  Well nourished, well developed in no acute distress HEENT: Normal NECK: No JVD; No carotid bruits CARDIAC: RRR, no murmurs, rubs, gallops RESPIRATORY:  Clear to auscultation without rales, wheezing or rhonchi  ABDOMEN: Soft, non-tender, non-distended MUSCULOSKELETAL: No edema SKIN: Warm and dry NEUROLOGIC:  Alert and oriented PSYCHIATRIC:  Normal affect      ASSESSMENT AND PLAN   Precordial pain, episodic  -Nonexertional -May be GI related.  Given information on GERD.  Recommend discussing further at upcoming GI appointment.  Coronary artery disease without angina -CTA of the coronaries May 2023 with minimal to mild CAD -Continue aspirin 81 mg and lipid therapy.  Hypertension, well controlled -Continue Lotensin.  Creatinine and potassium normal last month. -Goal BP is <130/80.  Recommend DASH diet (high in vegetables, fruits, low-fat dairy products, whole grains, poultry, fish, and nuts and low in sweets, sugar-sweetened beverages, and red meats), salt  restriction and increase physical activity.  Hyperlipidemia with goal LDL less than 70 -LDL 71 in September 2023.  Continue simvastatin. -Discussed cholesterol lowering diets - Mediterranean diet, DASH diet, vegetarian diet, low-carbohydrate diet and avoidance of trans fats.  Discussed healthier choice substitutes.  Nuts, high-fiber foods, and fiber supplements may also improve lipids.    Disposition - Follow-up in 1 year with Dr. Gwenlyn Found.   Medication Adjustments/Labs and Tests Ordered: Current medicines are reviewed at length with the patient today.  Concerns regarding medicines are outlined above.  No orders of the defined types were placed in this encounter.  No orders of the defined types were placed in this encounter.   Patient Instructions  Medication Instructions:  No changes *If you need a refill on your cardiac medications before your next appointment, please call your pharmacy*   Lab Work: No labs If you have labs (blood work) drawn today and your tests are completely normal, you will receive your results only by: [place] (if you have MyChart) OR A paper copy in the mail If you have any lab test that is abnormal or we need to change your treatment, we will call you to review the results.   Testing/Procedures: No Testing   Follow-Up: At [facility], you and your health needs are our priority.  As part of our continuing mission to provide you with exceptional heart care, we have created designated Provider Care Teams.  These Care Teams include your primary Cardiologist (physician) and Advanced Practice Providers (APPs -  Physician Assistants and Nurse Practitioners) who all work together to provide you with the care you need, when you need it.  We recommend signing up for the patient portal called "MyChart".  Sign up information is provided on this After Visit Summary.  MyChart is used to connect with patients for Virtual Visits (Telemedicine).  Patients are able to view lab/test results, encounter notes, upcoming appointments, etc.  Non-urgent messages can be sent to your provider as well.   To learn more about what you can do with MyChart, go to [email or web address].    Your next appointment:   1 year(s)  The format for your next appointment:   In Person  Provider:   Quay Burow, MD     Other Instructions Recommend discussing with Primary Care Provider Esophageal spasms and GERD.  Important Information About Sugar         Signed, Gaston Islam  04/14/2022 9:41 AM    [place]

## 2022-04-14 ENCOUNTER — Encounter: Payer: Self-pay | Admitting: Physician Assistant

## 2022-04-14 ENCOUNTER — Ambulatory Visit: Payer: PPO | Attending: Physician Assistant | Admitting: Physician Assistant

## 2022-04-14 VITALS — BP 132/84 | HR 65 | Ht 64.0 in | Wt 153.0 lb

## 2022-04-14 DIAGNOSIS — R072 Precordial pain: Secondary | ICD-10-CM | POA: Diagnosis not present

## 2022-04-14 DIAGNOSIS — I2510 Atherosclerotic heart disease of native coronary artery without angina pectoris: Secondary | ICD-10-CM

## 2022-04-14 DIAGNOSIS — E782 Mixed hyperlipidemia: Secondary | ICD-10-CM | POA: Diagnosis not present

## 2022-04-14 DIAGNOSIS — I10 Essential (primary) hypertension: Secondary | ICD-10-CM | POA: Diagnosis not present

## 2022-04-14 NOTE — Patient Instructions (Signed)
Medication Instructions:  No changes *If you need a refill on your cardiac medications before your next appointment, please call your pharmacy*   Lab Work: No labs If you have labs (blood work) drawn today and your tests are completely normal, you will receive your results only by: [place] (if you have MyChart) OR A paper copy in the mail If you have any lab test that is abnormal or we need to change your treatment, we will call you to review the results.   Testing/Procedures: No Testing   Follow-Up: At [facility], you and your health needs are our priority.  As part of our continuing mission to provide you with exceptional heart care, we have created designated Provider Care Teams.  These Care Teams include your primary Cardiologist (physician) and Advanced Practice Providers (APPs -  Physician Assistants and Nurse Practitioners) who all work together to provide you with the care you need, when you need it.  We recommend signing up for the patient portal called "MyChart".  Sign up information is provided on this After Visit Summary.  MyChart is used to connect with patients for Virtual Visits (Telemedicine).  Patients are able to view lab/test results, encounter notes, upcoming appointments, etc.  Non-urgent messages can be sent to your provider as well.   To learn more about what you can do with MyChart, go to [email or web address].    Your next appointment:   1 year(s)  The format for your next appointment:   In Person  Provider:   Quay Burow, MD     Other Instructions Recommend discussing with Primary Care Provider Esophageal spasms and GERD.  Important Information About Sugar

## 2022-04-28 DIAGNOSIS — R6884 Jaw pain: Secondary | ICD-10-CM | POA: Diagnosis not present

## 2022-04-28 DIAGNOSIS — H9202 Otalgia, left ear: Secondary | ICD-10-CM | POA: Diagnosis not present

## 2022-04-28 DIAGNOSIS — I10 Essential (primary) hypertension: Secondary | ICD-10-CM | POA: Diagnosis not present

## 2022-06-18 DIAGNOSIS — J069 Acute upper respiratory infection, unspecified: Secondary | ICD-10-CM | POA: Diagnosis not present

## 2022-06-21 ENCOUNTER — Emergency Department (HOSPITAL_COMMUNITY)
Admission: EM | Admit: 2022-06-21 | Discharge: 2022-06-21 | Disposition: A | Discharge status: Home / Self Care | Payer: PPO | Attending: Emergency Medicine | Admitting: Emergency Medicine

## 2022-06-21 ENCOUNTER — Other Ambulatory Visit: Payer: Self-pay

## 2022-06-21 ENCOUNTER — Emergency Department (HOSPITAL_COMMUNITY): Payer: PPO

## 2022-06-21 DIAGNOSIS — Z7982 Long term (current) use of aspirin: Secondary | ICD-10-CM | POA: Diagnosis not present

## 2022-06-21 DIAGNOSIS — R531 Weakness: Secondary | ICD-10-CM | POA: Diagnosis not present

## 2022-06-21 DIAGNOSIS — Z79899 Other long term (current) drug therapy: Secondary | ICD-10-CM | POA: Diagnosis not present

## 2022-06-21 DIAGNOSIS — R55 Syncope and collapse: Secondary | ICD-10-CM

## 2022-06-21 DIAGNOSIS — R110 Nausea: Secondary | ICD-10-CM | POA: Diagnosis not present

## 2022-06-21 DIAGNOSIS — U071 COVID-19: Secondary | ICD-10-CM | POA: Diagnosis not present

## 2022-06-21 DIAGNOSIS — I10 Essential (primary) hypertension: Secondary | ICD-10-CM | POA: Insufficient documentation

## 2022-06-21 DIAGNOSIS — S0990XA Unspecified injury of head, initial encounter: Secondary | ICD-10-CM | POA: Diagnosis not present

## 2022-06-21 DIAGNOSIS — Z85828 Personal history of other malignant neoplasm of skin: Secondary | ICD-10-CM | POA: Diagnosis not present

## 2022-06-21 LAB — CBC WITH DIFFERENTIAL/PLATELET
Abs Immature Granulocytes: 0.04 10*3/uL (ref 0.00–0.07)
Basophils Absolute: 0 10*3/uL (ref 0.0–0.1)
Basophils Relative: 0 %
Eosinophils Absolute: 0.1 10*3/uL (ref 0.0–0.5)
Eosinophils Relative: 1 %
HCT: 38.4 % (ref 36.0–46.0)
Hemoglobin: 12.6 g/dL (ref 12.0–15.0)
Immature Granulocytes: 0 %
Lymphocytes Relative: 15 %
Lymphs Abs: 1.9 10*3/uL (ref 0.7–4.0)
MCH: 29.4 pg (ref 26.0–34.0)
MCHC: 32.8 g/dL (ref 30.0–36.0)
MCV: 89.5 fL (ref 80.0–100.0)
Monocytes Absolute: 0.6 10*3/uL (ref 0.1–1.0)
Monocytes Relative: 4 %
Neutro Abs: 10.1 10*3/uL — ABNORMAL HIGH (ref 1.7–7.7)
Neutrophils Relative %: 80 %
Platelets: 234 10*3/uL (ref 150–400)
RBC: 4.29 MIL/uL (ref 3.87–5.11)
RDW: 12.6 % (ref 11.5–15.5)
WBC: 12.7 10*3/uL — ABNORMAL HIGH (ref 4.0–10.5)
nRBC: 0 % (ref 0.0–0.2)

## 2022-06-21 LAB — COMPREHENSIVE METABOLIC PANEL
ALT: 14 U/L (ref 0–44)
AST: 23 U/L (ref 15–41)
Albumin: 3.7 g/dL (ref 3.5–5.0)
Alkaline Phosphatase: 47 U/L (ref 38–126)
Anion gap: 9 (ref 5–15)
BUN: 11 mg/dL (ref 8–23)
CO2: 29 mmol/L (ref 22–32)
Calcium: 9.1 mg/dL (ref 8.9–10.3)
Chloride: 99 mmol/L (ref 98–111)
Creatinine, Ser: 0.71 mg/dL (ref 0.44–1.00)
GFR, Estimated: >60 mL/min (ref >60)
Glucose, Bld: 130 mg/dL — ABNORMAL HIGH (ref 70–99)
Potassium: 3.9 mmol/L (ref 3.5–5.1)
Sodium: 137 mmol/L (ref 135–145)
Total Bilirubin: 0.7 mg/dL (ref 0.3–1.2)
Total Protein: 6.5 g/dL (ref 6.5–8.1)

## 2022-06-21 LAB — RESP PANEL BY RT-PCR (RSV, FLU A&B, COVID)  RVPGX2
Influenza A by PCR: NEGATIVE
Influenza B by PCR: NEGATIVE
Resp Syncytial Virus by PCR: NEGATIVE
SARS Coronavirus 2 by RT PCR: POSITIVE — AB

## 2022-06-21 LAB — TROPONIN I (HIGH SENSITIVITY): Troponin I (High Sensitivity): 4 ng/L (ref <18)

## 2022-06-21 NOTE — ED Provider Notes (Signed)
[facility] DEPT Provider Note   CSN: [id number] Arrival date & time: 06/21/22  0623     History  Chief Complaint  Patient presents with   Loss of Consciousness    Amber Sloan is a 81 y.o. female.   Loss of Consciousness Patient presents after syncopal episode.  Was making food for the holiday today being Christmas and felt lightheaded and then passed out.  No chest pain.  States she did hit her head.  Not on blood thinners.  Has been a little bit fatigued.  Is been dealing with respiratory issues over the last month.  Had been on steroids antibiotics and an inhaler.  Still continued to cough.  No swelling in her legs.  States she passed out years ago.    Past Medical History:  Diagnosis Date   Anginal pain ([place])    had cardiac work up with Dr Gwenlyn Found, had a normal Stress test.   Arthritis    Breast mass, right    Hx of (Dr. Criss Rosales)   Chest pain    Constipation    Diverticulitis    Dysrhythmia    Environmental allergies    Family history of heart disease    Hyperlipidemia    pure, continue on Simvastatin   Hypertension    benign, uncontrolled, continue on Benazepril-Hydrochlorothiazide   Menopause    Obesity, unspecified    Osteopenia    on Evista since 2007   Peripheral neuropathy    left   PONV (postoperative nausea and vomiting)    Shortness of breath dyspnea    when walking upastairs    Sleep apnea    on cpap   Squamous cell carcinoma of face     Home Medications Prior to Admission medications   Medication Sig Start Date End Date Taking? Authorizing Provider  acetaminophen (TYLENOL) 650 MG CR tablet Take 1,300 mg by mouth at bedtime.    [provider]  aspirin 81 MG EC tablet 1 tablet    [provider]  benazepril-hydrochlorthiazide (LOTENSIN HCT) 10-12.5 MG per tablet Take 1 tablet by mouth every morning. 09/17/13   [provider]  Flaxseed, Linseed, (FLAX SEEDS PO) Take 1,200 mg by  mouth daily.    [provider]  GARLIC OIL PO Take 1 capsule by mouth daily.    [provider]  Magnesium 500 MG CAPS Take 1 capsule by mouth daily.    [provider]  metoprolol tartrate (LOPRESSOR) 25 MG tablet Take 1 tablet (25 mg total) by mouth once for 1 dose. Take 2 hours prior to procedure. 10/16/21 10/16/21  Lorretta Harp, MD  Multiple Vitamin (MULTIVITAMIN WITH MINERALS) TABS tablet Take 1 tablet by mouth daily.    [provider]  polyethylene glycol (MIRALAX / GLYCOLAX) packet Take 17 g by mouth daily.    [provider]  raloxifene (EVISTA) 60 MG tablet Take 60 mg by mouth daily.    [provider]  simvastatin (ZOCOR) 20 MG tablet Take 20 mg by mouth daily.    [provider]      Allergies    Parafon forte dsc [chlorzoxazone] and Sulfa antibiotics    Review of Systems   Review of Systems  Cardiovascular:  Positive for syncope.    Physical Exam Updated Vital Signs BP (!) 112/53 (BP Location: Left Arm)   Pulse 78   Temp 97.9 F (36.6 C) (Oral)   Resp 16   Ht '5\' 4"'$  (1.626 m)  Wt 69 kg   SpO2 96%   BMI 26.11 kg/m  Physical Exam Vitals and nursing note reviewed.  HENT:     Head: Atraumatic.  Cardiovascular:     Rate and Rhythm: Regular rhythm.  Pulmonary:     Breath sounds: No wheezing.  Abdominal:     Tenderness: There is no abdominal tenderness.  Musculoskeletal:        General: No tenderness.     Cervical back: Neck supple.  Skin:    General: Skin is warm.  Neurological:     Mental Status: She is alert.     ED Results / Procedures / Treatments   Labs (all labs ordered are listed, but only abnormal results are displayed) Labs Reviewed  RESP PANEL BY RT-PCR (RSV, FLU A&B, COVID)  RVPGX2 - Abnormal; Notable for the following components:      Result Value   SARS Coronavirus 2 by RT PCR POSITIVE (*)    All other components within normal limits  COMPREHENSIVE METABOLIC PANEL -  Abnormal; Notable for the following components:   Glucose, Bld 130 (*)    All other components within normal limits  CBC WITH DIFFERENTIAL/PLATELET - Abnormal; Notable for the following components:   WBC 12.7 (*)    Neutro Abs 10.1 (*)    All other components within normal limits  TROPONIN I (HIGH SENSITIVITY)  TROPONIN I (HIGH SENSITIVITY)    EKG None  Radiology CT HEAD WO CONTRAST (5MM)  Result Date: 06/21/2022 CLINICAL DATA:  Syncope, trauma EXAM: CT HEAD WITHOUT CONTRAST TECHNIQUE: Contiguous axial images were obtained from the base of the skull through the vertex without intravenous contrast. RADIATION DOSE REDUCTION: This exam was performed according to the departmental dose-optimization program which includes automated exposure control, adjustment of the mA and/or kV according to patient size and/or use of iterative reconstruction technique. COMPARISON:  None Available. FINDINGS: Brain: No acute intracranial findings are seen. There are no signs of bleeding within the cranium. Cortical sulci are prominent. There is decreased density in periventricular white matter. Vascular: Unremarkable. Skull: No fracture is seen in calvarium. Sinuses/Orbits: There are no air-fluid levels in paranasal sinuses. There is mucosal thickening in ethmoid and sphenoid sinuses. Other: None. IMPRESSION: No acute intracranial findings are seen in noncontrast CT brain. Atrophy. Small-vessel disease. Chronic sinusitis. Electronically Signed   By: Elmer Picker M.D.   On: 06/21/2022 10:59   DG Chest Portable 1 View  Result Date: 06/21/2022 CLINICAL DATA:  Weakness EXAM: PORTABLE CHEST 1 VIEW COMPARISON:  02/19/2016 FINDINGS: Artifact from EKG leads. Normal heart size and mediastinal contours. No acute infiltrate or edema. No effusion or pneumothorax. No acute osseous findings. IMPRESSION: No active disease. Electronically Signed   By: Jorje Guild M.D.   On: 06/21/2022 10:06    Procedures Procedures     Medications Ordered in ED Medications - No data to display  ED Course/ Medical Decision Making/ A&P                           Medical Decision Making Amount and/or Complexity of Data Reviewed Labs: ordered. Radiology: ordered.   Patient with syncope.  Fell.  Hit head.  Did have loss of consciousness.  Will get head CT.  Differential diagnosis does include intracranial injury.  However with the syncope will need to check for anemia electrolyte abnormalities cardiac monitoring to watch for arrhythmia.  Has been somewhat worn out due to respiratory type symptoms. Respiratory symptoms  have been going on for almost a month now.  X-ray reassuring.  However COVID test is positive.  Unsure about the timing of the COVID.  Potentially could even have been for a month ago and still PCR positive.  Monitoring reassuring.  Blood work reassuring.  Appears stable for discharge home with outpatient follow-up.  Traumatic workup is reassuring.  Chest x-ray and head CT reassuring.       Final Clinical Impression(s) / ED Diagnoses Final diagnoses:  Syncope, unspecified syncope type  COVID-19    Rx / DC Orders ED Discharge Orders     None         Davonna Belling, MD 06/21/22 1255

## 2022-06-21 NOTE — ED Triage Notes (Signed)
Pt BIB EMS from home after syncopal episode. Family reported to ems that pt sister was able to lower her to the ground but she still hit head on floor. Pt reports head pain.

## 2022-09-01 ENCOUNTER — Ambulatory Visit
Admission: RE | Admit: 2022-09-01 | Discharge: 2022-09-01 | Disposition: A | Discharge status: Home / Self Care | Payer: PPO | Source: Ambulatory Visit | Attending: Internal Medicine | Admitting: Internal Medicine

## 2022-09-01 DIAGNOSIS — M810 Age-related osteoporosis without current pathological fracture: Secondary | ICD-10-CM | POA: Diagnosis not present

## 2022-09-01 DIAGNOSIS — Z13820 Encounter for screening for osteoporosis: Secondary | ICD-10-CM

## 2022-09-01 DIAGNOSIS — Z780 Asymptomatic menopausal state: Secondary | ICD-10-CM | POA: Diagnosis not present

## 2022-09-02 DIAGNOSIS — M26629 Arthralgia of temporomandibular joint, unspecified side: Secondary | ICD-10-CM | POA: Insufficient documentation

## 2022-09-02 DIAGNOSIS — K219 Gastro-esophageal reflux disease without esophagitis: Secondary | ICD-10-CM | POA: Diagnosis present

## 2022-09-02 DIAGNOSIS — M26622 Arthralgia of left temporomandibular joint: Secondary | ICD-10-CM | POA: Diagnosis not present

## 2022-09-03 DIAGNOSIS — I10 Essential (primary) hypertension: Secondary | ICD-10-CM | POA: Diagnosis not present

## 2022-09-03 DIAGNOSIS — G4733 Obstructive sleep apnea (adult) (pediatric): Secondary | ICD-10-CM | POA: Diagnosis not present

## 2022-09-03 DIAGNOSIS — L84 Corns and callosities: Secondary | ICD-10-CM | POA: Diagnosis not present

## 2022-09-03 DIAGNOSIS — M8588 Other specified disorders of bone density and structure, other site: Secondary | ICD-10-CM | POA: Diagnosis not present

## 2022-09-03 DIAGNOSIS — K52832 Lymphocytic colitis: Secondary | ICD-10-CM | POA: Diagnosis not present

## 2022-09-03 DIAGNOSIS — R7303 Prediabetes: Secondary | ICD-10-CM | POA: Diagnosis not present

## 2022-09-03 DIAGNOSIS — E7800 Pure hypercholesterolemia, unspecified: Secondary | ICD-10-CM | POA: Diagnosis not present

## 2022-09-10 ENCOUNTER — Other Ambulatory Visit: Payer: Self-pay | Admitting: Internal Medicine

## 2022-09-10 DIAGNOSIS — Z1231 Encounter for screening mammogram for malignant neoplasm of breast: Secondary | ICD-10-CM

## 2022-09-28 DIAGNOSIS — H524 Presbyopia: Secondary | ICD-10-CM | POA: Diagnosis not present

## 2022-09-28 DIAGNOSIS — Z961 Presence of intraocular lens: Secondary | ICD-10-CM | POA: Diagnosis not present

## 2022-09-29 DIAGNOSIS — K219 Gastro-esophageal reflux disease without esophagitis: Secondary | ICD-10-CM | POA: Diagnosis not present

## 2022-09-29 DIAGNOSIS — M810 Age-related osteoporosis without current pathological fracture: Secondary | ICD-10-CM | POA: Diagnosis not present

## 2022-10-04 DIAGNOSIS — K52832 Lymphocytic colitis: Secondary | ICD-10-CM | POA: Diagnosis not present

## 2022-10-04 DIAGNOSIS — R195 Other fecal abnormalities: Secondary | ICD-10-CM | POA: Diagnosis not present

## 2022-10-12 DIAGNOSIS — L570 Actinic keratosis: Secondary | ICD-10-CM | POA: Diagnosis not present

## 2022-10-12 DIAGNOSIS — L814 Other melanin hyperpigmentation: Secondary | ICD-10-CM | POA: Diagnosis not present

## 2022-10-12 DIAGNOSIS — D225 Melanocytic nevi of trunk: Secondary | ICD-10-CM | POA: Diagnosis not present

## 2022-10-12 DIAGNOSIS — Z85828 Personal history of other malignant neoplasm of skin: Secondary | ICD-10-CM | POA: Diagnosis not present

## 2022-10-12 DIAGNOSIS — L578 Other skin changes due to chronic exposure to nonionizing radiation: Secondary | ICD-10-CM | POA: Diagnosis not present

## 2022-10-12 DIAGNOSIS — L821 Other seborrheic keratosis: Secondary | ICD-10-CM | POA: Diagnosis not present

## 2022-10-26 ENCOUNTER — Ambulatory Visit
Admission: RE | Admit: 2022-10-26 | Discharge: 2022-10-26 | Disposition: A | Discharge status: Home / Self Care | Payer: PPO | Source: Ambulatory Visit | Attending: Internal Medicine | Admitting: Internal Medicine

## 2022-10-26 DIAGNOSIS — Z1231 Encounter for screening mammogram for malignant neoplasm of breast: Secondary | ICD-10-CM | POA: Diagnosis not present

## 2023-01-24 NOTE — Progress Notes (Unsigned)
  Cardiology Office Note   Date:  01/26/2023  ID:  Amber Sloan, DOB 13-Sep-1940, MRN [id number] PCP:  Ollen Bowl, MD [facility] Cardiologist: Nanetta Batty, MD  Reason for visit: 9 month follow-up  History of Present Illness    Amber Sloan is a 82 y.o. female with a hx of hypertension, hyperlipidemia, strong family history of heart disease and intermittent chest pain, colitis, diverticulitis, gallbladder disease.   She last saw Dr. Allyson Sabal in April 2023 mentioned substernal chest pain approximately once a month, not purely exertional for the past 2 years.  CTA of the coronaries showed minimal to mild disease, coronary calcium score 215 which was 68th percentile.  I saw her in October 2023.  She discussed her grabbing central chest pain associated with lightheadedness occurring 1-2 times per week.  Episodes without known trigger.  Her chest pain was nonexertional.  Thought to be possibly GI related and given information on GERD, recommended to follow-up with GI.  Today, she brings up her ED visit on Christmas Day last year.  She states she was cooking and overexerting herself when she felt lightheaded and passed out.  COVID test was positive and she admitted to not feeling well for the week prior.  Otherwise she has occasional mild lightheadedness (chronic).  No recurrent syncope.  She mentions weight loss with better eating over past few years.  She has infrequent mild chest pressure.  She does state that she was started on omeprazole 40 mg every morning for reflux and this is greatly helped.  Otherwise, patient denies shortness of breath, leg swelling and palpitations.     Objective / Physical Exam   Vital signs:  BP 112/66 (BP Location: Left Arm, Patient Position: Sitting, Cuff Size: Normal)   Pulse 64   Ht 5\' 3"  (1.6 m)   Wt 153 lb 12.8 oz (69.8 kg)   SpO2 95%   BMI 27.24 kg/m     GEN: No acute distress NECK: No carotid bruits CARDIAC: RRR, no  murmurs RESPIRATORY:  Clear to auscultation without rales, wheezing or rhonchi  EXTREMITIES: No edema  Assessment and Plan   Precordial pain -Likely GERD related, currently on omeprazole  Coronary artery disease, no angina -CTA of the coronaries May 2023 with minimal to mild CAD  -Continue aspirin and statin therapy.  Hypertension, well-controlled -BP today 112/66.  Patient is currently on benazepril-hydrochlorothiazide 10-12.5 mg daily.  Recommend bring blood pressure log to her PCP with 3 readings weekly.  May be able to cut back on her medication, potentially stopping hydrochlorothiazide if her blood pressure runs on the lower side.  Hyperlipidemia with goal LDL less than 70 -LDL 71 in September 2023. -Continue simvastatin 20 mg daily.  Scheduled for lipid recheck in September. -Recommend cholesterol lowering diets - Mediterranean diet, DASH diet, vegetarian diet, low-carbohydrate diet and avoidance of trans fats.  Discussed healthier choice substitutes.  Nuts, high-fiber foods, and fiber supplements may also improve lipids.    Disposition - Follow-up in 1 year.  Signed, Cannon Kettle, PA-C  01/26/2023 [facility]

## 2023-01-26 ENCOUNTER — Encounter: Payer: Self-pay | Admitting: Physician Assistant

## 2023-01-26 ENCOUNTER — Ambulatory Visit: Payer: PPO | Attending: Physician Assistant | Admitting: Physician Assistant

## 2023-01-26 VITALS — BP 112/66 | HR 64 | Ht 63.0 in | Wt 153.8 lb

## 2023-01-26 DIAGNOSIS — I10 Essential (primary) hypertension: Secondary | ICD-10-CM | POA: Diagnosis not present

## 2023-01-26 DIAGNOSIS — E782 Mixed hyperlipidemia: Secondary | ICD-10-CM | POA: Diagnosis not present

## 2023-01-26 DIAGNOSIS — I2510 Atherosclerotic heart disease of native coronary artery without angina pectoris: Secondary | ICD-10-CM

## 2023-01-26 NOTE — Patient Instructions (Signed)
Medication Instructions:  No Changes *If you need a refill on your cardiac medications before your next appointment, please call your pharmacy*   Lab Work: No Labs If you have labs (blood work) drawn today and your tests are completely normal, you will receive your results only by: MyChart Message (if you have MyChart) OR A paper copy in the mail If you have any lab test that is abnormal or we need to change your treatment, we will call you to review the results.   Testing/Procedures: No Testing   Follow-Up: At [facility], you and your health needs are our priority.  As part of our continuing mission to provide you with exceptional heart care, we have created designated Provider Care Teams.  These Care Teams include your primary Cardiologist (physician) and Advanced Practice Providers (APPs -  Physician Assistants and Nurse Practitioners) who all work together to provide you with the care you need, when you need it.  We recommend signing up for the patient portal called "MyChart".  Sign up information is provided on this After Visit Summary.  MyChart is used to connect with patients for Virtual Visits (Telemedicine).  Patients are able to view lab/test results, encounter notes, upcoming appointments, etc.  Non-urgent messages can be sent to your provider as well.   To learn more about what you can do with MyChart, go to [email or web address].    Your next appointment:   1 year(s)  Provider:   Nanetta Batty, MD   Other Instructions Please bring Blood Pressure Log to PCP appointment in September,. To Evaluate decreasing Blood Pressure Medication.

## 2023-03-10 DIAGNOSIS — E7800 Pure hypercholesterolemia, unspecified: Secondary | ICD-10-CM | POA: Diagnosis not present

## 2023-03-10 DIAGNOSIS — K52832 Lymphocytic colitis: Secondary | ICD-10-CM | POA: Diagnosis not present

## 2023-03-10 DIAGNOSIS — Z0000 Encounter for general adult medical examination without abnormal findings: Secondary | ICD-10-CM | POA: Diagnosis not present

## 2023-03-10 DIAGNOSIS — G4733 Obstructive sleep apnea (adult) (pediatric): Secondary | ICD-10-CM | POA: Diagnosis not present

## 2023-03-10 DIAGNOSIS — C44319 Basal cell carcinoma of skin of other parts of face: Secondary | ICD-10-CM | POA: Diagnosis not present

## 2023-03-10 DIAGNOSIS — M810 Age-related osteoporosis without current pathological fracture: Secondary | ICD-10-CM | POA: Diagnosis not present

## 2023-03-10 DIAGNOSIS — R7303 Prediabetes: Secondary | ICD-10-CM | POA: Diagnosis not present

## 2023-03-10 DIAGNOSIS — K219 Gastro-esophageal reflux disease without esophagitis: Secondary | ICD-10-CM | POA: Diagnosis not present

## 2023-03-10 DIAGNOSIS — I10 Essential (primary) hypertension: Secondary | ICD-10-CM | POA: Diagnosis not present

## 2023-05-27 ENCOUNTER — Emergency Department (HOSPITAL_COMMUNITY): Payer: PPO

## 2023-05-27 ENCOUNTER — Encounter (HOSPITAL_COMMUNITY): Payer: Self-pay

## 2023-05-27 ENCOUNTER — Other Ambulatory Visit: Payer: Self-pay

## 2023-05-27 ENCOUNTER — Inpatient Hospital Stay (HOSPITAL_COMMUNITY)
Admission: EM | Admit: 2023-05-27 | Discharge: 2023-05-30 | DRG: 522 | Disposition: A | Discharge status: Home Health | Payer: PPO | Attending: Orthopedic Surgery | Admitting: Orthopedic Surgery

## 2023-05-27 DIAGNOSIS — Z90710 Acquired absence of both cervix and uterus: Secondary | ICD-10-CM

## 2023-05-27 DIAGNOSIS — Z7982 Long term (current) use of aspirin: Secondary | ICD-10-CM | POA: Diagnosis not present

## 2023-05-27 DIAGNOSIS — K219 Gastro-esophageal reflux disease without esophagitis: Secondary | ICD-10-CM | POA: Diagnosis present

## 2023-05-27 DIAGNOSIS — R7303 Prediabetes: Secondary | ICD-10-CM | POA: Diagnosis not present

## 2023-05-27 DIAGNOSIS — Z79899 Other long term (current) drug therapy: Secondary | ICD-10-CM | POA: Diagnosis not present

## 2023-05-27 DIAGNOSIS — Z823 Family history of stroke: Secondary | ICD-10-CM

## 2023-05-27 DIAGNOSIS — Z85828 Personal history of other malignant neoplasm of skin: Secondary | ICD-10-CM | POA: Diagnosis not present

## 2023-05-27 DIAGNOSIS — D62 Acute posthemorrhagic anemia: Secondary | ICD-10-CM | POA: Diagnosis not present

## 2023-05-27 DIAGNOSIS — E876 Hypokalemia: Secondary | ICD-10-CM | POA: Diagnosis present

## 2023-05-27 DIAGNOSIS — I10 Essential (primary) hypertension: Secondary | ICD-10-CM | POA: Diagnosis not present

## 2023-05-27 DIAGNOSIS — Z882 Allergy status to sulfonamides status: Secondary | ICD-10-CM

## 2023-05-27 DIAGNOSIS — Z8249 Family history of ischemic heart disease and other diseases of the circulatory system: Secondary | ICD-10-CM

## 2023-05-27 DIAGNOSIS — Z833 Family history of diabetes mellitus: Secondary | ICD-10-CM

## 2023-05-27 DIAGNOSIS — M25552 Pain in left hip: Secondary | ICD-10-CM | POA: Diagnosis not present

## 2023-05-27 DIAGNOSIS — E785 Hyperlipidemia, unspecified: Secondary | ICD-10-CM | POA: Diagnosis present

## 2023-05-27 DIAGNOSIS — S72142A Displaced intertrochanteric fracture of left femur, initial encounter for closed fracture: Secondary | ICD-10-CM | POA: Diagnosis not present

## 2023-05-27 DIAGNOSIS — Y92009 Unspecified place in unspecified non-institutional (private) residence as the place of occurrence of the external cause: Secondary | ICD-10-CM

## 2023-05-27 DIAGNOSIS — M1990 Unspecified osteoarthritis, unspecified site: Secondary | ICD-10-CM | POA: Diagnosis present

## 2023-05-27 DIAGNOSIS — E669 Obesity, unspecified: Secondary | ICD-10-CM | POA: Diagnosis present

## 2023-05-27 DIAGNOSIS — R0689 Other abnormalities of breathing: Secondary | ICD-10-CM | POA: Diagnosis not present

## 2023-05-27 DIAGNOSIS — Z83438 Family history of other disorder of lipoprotein metabolism and other lipidemia: Secondary | ICD-10-CM

## 2023-05-27 DIAGNOSIS — Z981 Arthrodesis status: Secondary | ICD-10-CM

## 2023-05-27 DIAGNOSIS — W08XXXA Fall from other furniture, initial encounter: Secondary | ICD-10-CM | POA: Diagnosis present

## 2023-05-27 DIAGNOSIS — Z6827 Body mass index (BMI) 27.0-27.9, adult: Secondary | ICD-10-CM

## 2023-05-27 DIAGNOSIS — M545 Low back pain, unspecified: Secondary | ICD-10-CM | POA: Diagnosis present

## 2023-05-27 DIAGNOSIS — G629 Polyneuropathy, unspecified: Secondary | ICD-10-CM | POA: Diagnosis present

## 2023-05-27 DIAGNOSIS — I2510 Atherosclerotic heart disease of native coronary artery without angina pectoris: Secondary | ICD-10-CM | POA: Diagnosis present

## 2023-05-27 DIAGNOSIS — Z043 Encounter for examination and observation following other accident: Secondary | ICD-10-CM | POA: Diagnosis not present

## 2023-05-27 DIAGNOSIS — G4733 Obstructive sleep apnea (adult) (pediatric): Secondary | ICD-10-CM | POA: Diagnosis present

## 2023-05-27 DIAGNOSIS — Z91048 Other nonmedicinal substance allergy status: Secondary | ICD-10-CM | POA: Diagnosis not present

## 2023-05-27 DIAGNOSIS — S72002A Fracture of unspecified part of neck of left femur, initial encounter for closed fracture: Secondary | ICD-10-CM | POA: Diagnosis not present

## 2023-05-27 DIAGNOSIS — R0989 Other specified symptoms and signs involving the circulatory and respiratory systems: Secondary | ICD-10-CM | POA: Diagnosis not present

## 2023-05-27 DIAGNOSIS — Z471 Aftercare following joint replacement surgery: Secondary | ICD-10-CM | POA: Diagnosis not present

## 2023-05-27 DIAGNOSIS — R112 Nausea with vomiting, unspecified: Secondary | ICD-10-CM | POA: Diagnosis not present

## 2023-05-27 DIAGNOSIS — S0990XA Unspecified injury of head, initial encounter: Secondary | ICD-10-CM | POA: Diagnosis not present

## 2023-05-27 DIAGNOSIS — Z9049 Acquired absence of other specified parts of digestive tract: Secondary | ICD-10-CM | POA: Diagnosis not present

## 2023-05-27 DIAGNOSIS — I6523 Occlusion and stenosis of bilateral carotid arteries: Secondary | ICD-10-CM | POA: Diagnosis not present

## 2023-05-27 DIAGNOSIS — Z888 Allergy status to other drugs, medicaments and biological substances status: Secondary | ICD-10-CM

## 2023-05-27 DIAGNOSIS — G8929 Other chronic pain: Secondary | ICD-10-CM | POA: Diagnosis present

## 2023-05-27 DIAGNOSIS — Z96642 Presence of left artificial hip joint: Secondary | ICD-10-CM | POA: Diagnosis not present

## 2023-05-27 DIAGNOSIS — E871 Hypo-osmolality and hyponatremia: Secondary | ICD-10-CM | POA: Diagnosis present

## 2023-05-27 DIAGNOSIS — M810 Age-related osteoporosis without current pathological fracture: Secondary | ICD-10-CM | POA: Diagnosis present

## 2023-05-27 DIAGNOSIS — G4730 Sleep apnea, unspecified: Secondary | ICD-10-CM | POA: Diagnosis not present

## 2023-05-27 DIAGNOSIS — W19XXXA Unspecified fall, initial encounter: Secondary | ICD-10-CM | POA: Diagnosis not present

## 2023-05-27 DIAGNOSIS — D72829 Elevated white blood cell count, unspecified: Secondary | ICD-10-CM | POA: Diagnosis present

## 2023-05-27 LAB — CBC WITH DIFFERENTIAL/PLATELET
Abs Immature Granulocytes: 0.07 10*3/uL (ref 0.00–0.07)
Basophils Absolute: 0.1 10*3/uL (ref 0.0–0.1)
Basophils Relative: 0 %
Eosinophils Absolute: 0 10*3/uL (ref 0.0–0.5)
Eosinophils Relative: 0 %
HCT: 39.1 % (ref 36.0–46.0)
Hemoglobin: 13.2 g/dL (ref 12.0–15.0)
Immature Granulocytes: 1 %
Lymphocytes Relative: 12 %
Lymphs Abs: 1.8 10*3/uL (ref 0.7–4.0)
MCH: 30 pg (ref 26.0–34.0)
MCHC: 33.8 g/dL (ref 30.0–36.0)
MCV: 88.9 fL (ref 80.0–100.0)
Monocytes Absolute: 0.6 10*3/uL (ref 0.1–1.0)
Monocytes Relative: 4 %
Neutro Abs: 12.3 10*3/uL — ABNORMAL HIGH (ref 1.7–7.7)
Neutrophils Relative %: 83 %
Platelets: 189 10*3/uL (ref 150–400)
RBC: 4.4 MIL/uL (ref 3.87–5.11)
RDW: 12.5 % (ref 11.5–15.5)
WBC: 14.8 10*3/uL — ABNORMAL HIGH (ref 4.0–10.5)
nRBC: 0 % (ref 0.0–0.2)

## 2023-05-27 LAB — COMPREHENSIVE METABOLIC PANEL
ALT: 14 U/L (ref 0–44)
AST: 30 U/L (ref 15–41)
Albumin: 4 g/dL (ref 3.5–5.0)
Alkaline Phosphatase: 60 U/L (ref 38–126)
Anion gap: 12 (ref 5–15)
BUN: 9 mg/dL (ref 8–23)
CO2: 26 mmol/L (ref 22–32)
Calcium: 9 mg/dL (ref 8.9–10.3)
Chloride: 96 mmol/L — ABNORMAL LOW (ref 98–111)
Creatinine, Ser: 0.76 mg/dL (ref 0.44–1.00)
GFR, Estimated: >60 mL/min (ref >60)
Glucose, Bld: 146 mg/dL — ABNORMAL HIGH (ref 70–99)
Potassium: 3.4 mmol/L — ABNORMAL LOW (ref 3.5–5.1)
Sodium: 134 mmol/L — ABNORMAL LOW (ref 135–145)
Total Bilirubin: 0.5 mg/dL (ref <1.2)
Total Protein: 6.9 g/dL (ref 6.5–8.1)

## 2023-05-27 LAB — HEMOGLOBIN A1C
Hgb A1c MFr Bld: 5.8 % — ABNORMAL HIGH (ref 4.8–5.6)
Mean Plasma Glucose: 119.76 mg/dL

## 2023-05-27 LAB — GLUCOSE, CAPILLARY: Glucose-Capillary: 175 mg/dL — ABNORMAL HIGH (ref 70–99)

## 2023-05-27 MED ORDER — SENNA 8.6 MG PO TABS
1.0000 | ORAL_TABLET | Freq: Two times a day (BID) | ORAL | Status: DC
  Administered 2023-05-27: 8.6 mg via ORAL
  Filled 2023-05-27: qty 1

## 2023-05-27 MED ORDER — LACTATED RINGERS IV BOLUS
1000.0000 mL | Freq: Once | INTRAVENOUS | Status: AC
  Administered 2023-05-27: 1000 mL via INTRAVENOUS

## 2023-05-27 MED ORDER — INSULIN ASPART 100 UNIT/ML IJ SOLN
0.0000 [IU] | Freq: Three times a day (TID) | INTRAMUSCULAR | Status: DC
  Administered 2023-05-28 – 2023-05-29 (×3): 3 [IU] via SUBCUTANEOUS
  Filled 2023-05-27: qty 0.15

## 2023-05-27 MED ORDER — PANTOPRAZOLE SODIUM 40 MG PO TBEC
40.0000 mg | DELAYED_RELEASE_TABLET | Freq: Two times a day (BID) | ORAL | Status: DC
  Administered 2023-05-27: 40 mg via ORAL
  Filled 2023-05-27: qty 1

## 2023-05-27 MED ORDER — FENTANYL CITRATE PF 50 MCG/ML IJ SOSY
50.0000 ug | PREFILLED_SYRINGE | Freq: Once | INTRAMUSCULAR | Status: AC
  Administered 2023-05-27: 50 ug via INTRAVENOUS
  Filled 2023-05-27: qty 1

## 2023-05-27 MED ORDER — HYDROCODONE-ACETAMINOPHEN 5-325 MG PO TABS
1.0000 | ORAL_TABLET | Freq: Four times a day (QID) | ORAL | Status: DC | PRN
  Administered 2023-05-27 (×2): 1 via ORAL
  Filled 2023-05-27 (×2): qty 1

## 2023-05-27 MED ORDER — KETOROLAC TROMETHAMINE 30 MG/ML IJ SOLN
30.0000 mg | Freq: Four times a day (QID) | INTRAMUSCULAR | Status: DC
  Administered 2023-05-27 – 2023-05-28 (×2): 30 mg via INTRAVENOUS
  Filled 2023-05-27 (×2): qty 1

## 2023-05-27 MED ORDER — SODIUM CHLORIDE 0.45 % IV SOLN: INTRAVENOUS | Status: DC

## 2023-05-27 MED ORDER — INSULIN ASPART 100 UNIT/ML IJ SOLN
0.0000 [IU] | Freq: Every day | INTRAMUSCULAR | Status: DC
Start: 2023-05-27 — End: 2023-05-30
  Filled 2023-05-27: qty 0.05

## 2023-05-27 MED ORDER — ONDANSETRON HCL 4 MG/2ML IJ SOLN
4.0000 mg | Freq: Once | INTRAMUSCULAR | Status: DC
  Filled 2023-05-27: qty 2

## 2023-05-27 MED ORDER — ZOLPIDEM TARTRATE 5 MG PO TABS: 5.0000 mg | ORAL_TABLET | Freq: Every evening | ORAL | Status: DC | PRN

## 2023-05-27 MED ORDER — HEPARIN SODIUM (PORCINE) 5000 UNIT/ML IJ SOLN
5000.0000 [IU] | Freq: Three times a day (TID) | INTRAMUSCULAR | Status: DC
  Administered 2023-05-27 – 2023-05-28 (×2): 5000 [IU] via SUBCUTANEOUS
  Filled 2023-05-27 (×2): qty 1

## 2023-05-27 NOTE — ED Provider Notes (Signed)
[place] EMERGENCY DEPARTMENT AT [facility] Provider Note   CSN: [id number] Arrival date & time: 05/27/23  1456     History  Chief Complaint  Patient presents with   Hip Pain    Amber Sloan is a 82 y.o. female.   Hip Pain  82 year old female history of hypertension, hyperlipidemia presenting for fall.  She was sitting on a stool today when she fell off and landed on her left hip/side.  She did hit her head but did not lose conscious.  She has severe pain and limited range of motion of her left hip and femur.  No pain to the knee or below.  No numbness or tingling.  She vomited after receiving fentanyl with EMS and got Zofran with them.  She still significant pain.  No headache, no neck pain.  No chest pain or back pain or abdominal pain.  No pain in her other extremities.  Not anticoagulated.     Home Medications Prior to Admission medications   Medication Sig Start Date End Date Taking? Authorizing Provider  acetaminophen (TYLENOL) 650 MG CR tablet Take 1,300 mg by mouth at bedtime.   Yes [provider]  aspirin 81 MG EC tablet Take 81 mg by mouth in the morning.   Yes [provider]  benazepril-hydrochlorthiazide (LOTENSIN HCT) 10-12.5 MG per tablet Take 1 tablet by mouth every morning. 09/17/13  Yes [provider]  Calcium-Magnesium-Zinc (CAL-MAG-ZINC PO) Take 1 tablet by mouth daily with breakfast.   Yes [provider]  Cholecalciferol (VITAMIN D3) 50 MCG (2000 UT) TABS Take 2,000 Units by mouth in the morning.   Yes [provider]  Flaxseed, Linseed, (FLAX SEEDS PO) Take 1,200 mg by mouth daily.   Yes [provider]  GARLIC OIL PO Take 1 tablet by mouth daily.   Yes [provider]  Magnesium 500 MG CAPS Take 500 mg by mouth daily.   Yes [provider]  Multiple Vitamin (MULTIVITAMIN WITH MINERALS) TABS tablet Take 1 tablet by mouth daily with breakfast.   Yes [provider]  omeprazole (PRILOSEC) 40 MG capsule Take 40 mg by mouth daily before breakfast. 12/29/22  Yes [provider]  raloxifene (EVISTA) 60 MG tablet Take 60 mg by mouth in the morning.   Yes [provider]  simvastatin (ZOCOR) 20 MG tablet Take 20 mg by mouth at bedtime.   Yes [provider]  Turmeric Extra Strength CAPS Take 1,000 mg by mouth See admin instructions. Qunol Turmeric Curcumin Complex capsules- Take 1,000 mg by mouth in the morning   Yes [provider]  zinc gluconate 50 MG tablet Take 50 mg by mouth in the morning.   Yes [provider]  metoprolol tartrate (LOPRESSOR) 25 MG tablet Take 1 tablet (25 mg total) by mouth once for 1 dose. Take 2 hours prior to procedure. Patient not taking: Reported on 05/27/2023 10/16/21 05/27/23  Runell Gess, MD      Allergies    Tape, Parafon forte dsc [chlorzoxazone], and Sulfa antibiotics    Review of Systems   Review of Systems Review of systems completed and notable as per HPI.  ROS otherwise negative.   Physical Exam Updated Vital Signs BP (!) 130/59 (BP Location: Left Arm)   Pulse 70   Temp 98.5 F (36.9 C)   Resp 15   Ht 5' 3.5" (1.613 m)   Wt 72 kg   SpO2 95%   BMI 27.68  kg/m  Physical Exam Vitals and nursing note reviewed.  Constitutional:      General: She is not in acute distress.    Appearance: She is well-developed.  HENT:     Head: Normocephalic and atraumatic.     Nose: Nose normal.     Mouth/Throat:     Mouth: Mucous membranes are moist.     Pharynx: Oropharynx is clear.  Eyes:     Extraocular Movements: Extraocular movements intact.     Conjunctiva/sclera: Conjunctivae normal.     Pupils: Pupils are equal, round, and reactive to light.  Cardiovascular:     Rate and Rhythm: Normal rate and regular rhythm.     Heart sounds: No murmur heard. Pulmonary:     Effort: Pulmonary effort is normal. No respiratory distress.     Breath sounds: Normal  breath sounds.  Abdominal:     Palpations: Abdomen is soft.     Tenderness: There is no abdominal tenderness. There is no guarding or rebound.  Musculoskeletal:        General: No swelling.     Cervical back: Normal range of motion and neck supple. No rigidity or tenderness.     Comments: Left extremity held with some hip flexion, externally rotated and shortened.  Very limited range of motion of the left hip due to pain.  No tenderness along the knee or distal to this, forage motion of the knee and ankle.  2+ DP pulse bilaterally.  No pain or tenderness to the other extremities.  No skin changes.  Chest, abdomen, back all nontender without signs of trauma.  Skin:    General: Skin is warm and dry.     Capillary Refill: Capillary refill takes less than 2 seconds.  Neurological:     General: No focal deficit present.     Mental Status: She is alert and oriented to person, place, and time. Mental status is at baseline.  Psychiatric:        Mood and Affect: Mood normal.     ED Results / Procedures / Treatments   Labs (all labs ordered are listed, but only abnormal results are displayed) Labs Reviewed  COMPREHENSIVE METABOLIC PANEL - Abnormal; Notable for the following components:      Result Value   Sodium 134 (*)    Potassium 3.4 (*)    Chloride 96 (*)    Glucose, Bld 146 (*)    All other components within normal limits  CBC WITH DIFFERENTIAL/PLATELET - Abnormal; Notable for the following components:   WBC 14.8 (*)    Neutro Abs 12.3 (*)    All other components within normal limits  GLUCOSE, CAPILLARY - Abnormal; Notable for the following components:   Glucose-Capillary 175 (*)    All other components within normal limits  SURGICAL PCR SCREEN  HEMOGLOBIN A1C  LIPID PANEL    EKG None  Radiology DG Hip Unilat With Pelvis 2-3 Views Left  Result Date: 05/27/2023 CLINICAL DATA:  Fall are again EXAM: DG HIP (WITH OR WITHOUT PELVIS) 2-3V LEFT COMPARISON:  None Available.  FINDINGS: There is an acute fracture through the left femoral neck with mild superolateral displacement of the distal fracture fragment. There is no dislocation. Joint spaces are maintained. Lower lumbar fusion hardware is present. IMPRESSION: Acute fracture through the left femoral neck. Electronically Signed   By: Darliss Cheney M.D.   On: 05/27/2023 16:36   DG Femur 1V Left  Result Date: 05/27/2023 CLINICAL DATA:  Hip pain status post  fall. EXAM: LEFT FEMUR 1 VIEW COMPARISON:  Concurrent hip radiographs. MRI of the left knee 05/17/2012. FINDINGS: AP views of the proximal and distal left thigh demonstrate an acute fracture of the left femoral neck with mild superior displacement. The distal femur appears intact as imaged in the AP projection. Probable degenerative changes at the left knee. No focal soft tissue abnormalities are identified. IMPRESSION: Acute fracture of the left femoral neck. No evidence of distal left femur injury on single AP view. If there are distal symptoms, further evaluation recommended. Electronically Signed   By: Carey Bullocks M.D.   On: 05/27/2023 16:35   DG Chest Port 1 View  Result Date: 05/27/2023 CLINICAL DATA:  Fall. EXAM: PORTABLE CHEST 1 VIEW COMPARISON:  June 21, 2022. FINDINGS: The heart size and mediastinal contours are within normal limits. Mild central pulmonary vascular congestion is noted. No definite consolidative process is noted. The visualized skeletal structures are unremarkable. IMPRESSION: Mild central pulmonary vascular congestion. Electronically Signed   By: Lupita Raider M.D.   On: 05/27/2023 16:34   CT Head Wo Contrast  Result Date: 05/27/2023 CLINICAL DATA:  Head trauma.  Patient fell from stool. EXAM: CT HEAD WITHOUT CONTRAST TECHNIQUE: Contiguous axial images were obtained from the base of the skull through the vertex without intravenous contrast. RADIATION DOSE REDUCTION: This exam was performed according to the departmental  dose-optimization program which includes automated exposure control, adjustment of the mA and/or kV according to patient size and/or use of iterative reconstruction technique. COMPARISON:  CT head without contrast 06/21/2022 FINDINGS: Brain: Mild periventricular white matter hypoattenuation is stable, left greater than right. No acute infarct, hemorrhage, or mass lesion is present. Deep brain nuclei are within normal limits. The ventricles are of normal size. No significant extraaxial fluid collection is present. The brainstem and cerebellum are within normal limits. Midline structures are within normal limits. Vascular: Atherosclerotic calcifications are present within the cavernous internal carotid arteries bilaterally. No hyperdense vessel is present. Skull: Calvarium is intact. No focal lytic or blastic lesions are present. No significant extracranial soft tissue lesion is present. Sinuses/Orbits: Secretions are present in the right sphenoid sinus. The paranasal sinuses and mastoid air cells are otherwise clear. Bilateral lens replacements are noted. Globes and orbits are otherwise unremarkable. IMPRESSION: 1. No acute intracranial abnormality or significant interval change. 2. Stable mild periventricular white matter hypoattenuation, left greater than right. This likely reflects the sequela of chronic microvascular ischemia. 3. Secretions in the right sphenoid sinus. This may reflect acute sinusitis. Electronically Signed   By: Marin Roberts M.D.   On: 05/27/2023 15:54    Procedures Procedures    Medications Ordered in ED Medications  ondansetron (ZOFRAN) injection 4 mg (4 mg Intravenous Not Given 05/27/23 1657)  HYDROcodone-acetaminophen (NORCO/VICODIN) 5-325 MG per tablet 1-2 tablet (1 tablet Oral Given 05/27/23 2030)  heparin injection 5,000 Units (5,000 Units Subcutaneous Given 05/27/23 2259)  0.45 % sodium chloride infusion (has no administration in time range)  zolpidem (AMBIEN) tablet  5 mg (has no administration in time range)  senna (SENOKOT) tablet 8.6 mg (8.6 mg Oral Given 05/27/23 2301)  ketorolac (TORADOL) 30 MG/ML injection 30 mg (30 mg Intravenous Given 05/27/23 2304)  insulin aspart (novoLOG) injection 0-15 Units (has no administration in time range)  insulin aspart (novoLOG) injection 0-5 Units ( Subcutaneous Not Given 05/27/23 2300)  pantoprazole (PROTONIX) EC tablet 40 mg (40 mg Oral Given 05/27/23 2301)  lactated ringers bolus 1,000 mL (0 mLs Intravenous Stopped 05/27/23 1822)  fentaNYL (SUBLIMAZE) injection 50 mcg (50 mcg Intravenous Given 05/27/23 1627)    ED Course/ Medical Decision Making/ A&P Clinical Course as of 05/27/23 2315  Fri May 27, 2023  1646 Discussed with Dr. Linna Caprice with orthopedics to recommends hospitalist admission [JD]    Clinical Course User Index [JD] Laurence Spates, MD                                 Medical Decision Making Amount and/or Complexity of Data Reviewed Labs: ordered. Radiology: ordered.  Risk Prescription drug management. Decision regarding hospitalization.   Medical Decision Making:   Renise Ribble is a 82 y.o. female who presented to the ED today with mechanical fall.  She has significant left hip pain concerning for fracture or dislocation.  Pain medication ordered.  Also hit her head.  No signs of C-spine or chest or abdomen or pelvis trauma.  Neurovascular intact.   Patient placed on continuous vitals and telemetry monitoring while in ED which was reviewed periodically.  Reviewed and confirmed nursing documentation for past medical history, family history, social history.  Reassessment and Plan:   X-ray reviewed, notable for hip fracture.  No dislocation.  CT head without acute findings.  She remained stable and neurovascularly intact.  Labs otherwise noted for leukocytosis likely reactive.  Discussed with Dr. Linna Caprice with orthopedics to recommends hospitalist admission.  Discussed with  hospitalist and admitted.   Patient's presentation is most consistent with acute presentation with potential threat to life or bodily function.           Final Clinical Impression(s) / ED Diagnoses Final diagnoses:  Closed fracture of left hip, initial encounter [facility])    Rx / DC Orders ED Discharge Orders     None         Laurence Spates, MD 05/27/23 2315

## 2023-05-27 NOTE — H&P (Signed)
History and Physical    Amber Sloan GUY:[id number] DOB: 01/05/1941 DOA: 05/27/2023  DOS: the patient was seen and examined on 05/27/2023  PCP: Ollen Bowl, MD   Patient coming from: Home  I have personally briefly reviewed patient's old medical records in [facility]  Amber Sloan is a 82 y.o. female with a hx of hypertension, hyperlipidemia, strong family history of heart disease and intermittent chest pain, colitis, diverticulitis, gallbladder disease and is prediabetic. She waslast seen by her cardiologist 01/26/23: chest pain was attributed to GERD. She had a CTA coronaries - minimal to mild ASVD. She was dining at home and fell off of a stool landing on her left hip. She had immediate pain and could not bear weight. She presents to [facility] for evaluation.    ED Course: 97.7  148/71  HR 75  rr 16. Pleasant woman comfortable on stretcher in no acute distress. Full conversant. Lab: K 3.4, WBC 14.8 w/ nl diff (demargination) Hgb 13.2 CXR - mild central vascular congestion. X-ray left hip/femur - minimally displaced intertrochanteric fracture. EDP consulted Dr. Linna Caprice who will see patient. TRH asked to admit for medical management.   Review of Systems:  Review of Systems  Constitutional: Negative.   HENT: Negative.    Eyes: Negative.   Respiratory: Negative.    Cardiovascular: Negative.   Gastrointestinal: Negative.   Genitourinary: Negative.   Skin: Negative.   Neurological: Negative.   Endo/Heme/Allergies: Negative.   Psychiatric/Behavioral: Negative.      Past Medical History:  Diagnosis Date   Anginal pain (HCC)    had cardiac work up with Dr Allyson Sabal, had a normal Stress test.   Arthritis    Breast mass, right    Hx of (Dr. Parke Simmers)   Chest pain    Constipation    Diverticulitis    Dysrhythmia    Environmental allergies    Family history of heart disease    Hyperlipidemia    pure, continue on Simvastatin   Hypertension    benign,  uncontrolled, continue on Benazepril-Hydrochlorothiazide   Menopause    Obesity, unspecified    Osteopenia    on Evista since 2007   Peripheral neuropathy    left   PONV (postoperative nausea and vomiting)    Shortness of breath dyspnea    when walking upastairs    Sleep apnea    on cpap   Squamous cell carcinoma of face     Past Surgical History:  Procedure Laterality Date   ABDOMINAL HYSTERECTOMY  1986   still has Ovaries   ABDOMINAL HYSTERECTOMY     BACK SURGERY     BREAST EXCISIONAL BIOPSY Right    BREAST FIBROADENOMA SURGERY  03/1997   Right breast   CHOLECYSTECTOMY N/A 05/16/2014   Procedure: LAPAROSCOPIC CHOLECYSTECTOMY WITH INTRAOPERATIVE CHOLANGIOGRAM;  Surgeon: Avel Peace, MD;  Location: [facility] OR;  Service: General;  Laterality: N/A;   COLONOSCOPY     EVALUATION UNDER ANESTHESIA WITH STRABISMUS REPAIR     LUMBAR FUSION  10/2005   5,    TONSILLECTOMY     TONSILLECTOMY AND ADENOIDECTOMY      Soc Hx - married 61 years. No children. Lives with spouse and is I-ADLs   reports that she has never smoked. She has never used smokeless tobacco. She reports that she does not drink alcohol and does not use drugs.  Allergies  Allergen Reactions   Paraffin Other (See Comments)   Parafon Forte Dsc [Chlorzoxazone] Other (See Comments)  Old allergy unknown reaction.    Sulfa Antibiotics Nausea And Vomiting    Drugs   Sulfamethoxazole-Trimethoprim Other (See Comments)    Family History  Problem Relation Age of Onset   Hypertension Mother    Coronary artery disease Mother    Hypertension Father    Coronary artery disease Father    Hypertension Brother    CVA Brother    Hyperlipidemia Brother    Diabetes Mellitus II Brother    Hypertension Brother    CVA Brother    Hyperlipidemia Brother    Diabetes Mellitus II Brother    Hypertension Brother    CVA Brother    Hyperlipidemia Brother    Diabetes Mellitus II Brother     Prior to Admission medications    Medication Sig Start Date End Date Taking? Authorizing Provider  acetaminophen (TYLENOL) 650 MG CR tablet Take 1,300 mg by mouth at bedtime.    [provider]  aspirin 81 MG EC tablet 1 tablet    [provider]  benazepril-hydrochlorthiazide (LOTENSIN HCT) 10-12.5 MG per tablet Take 1 tablet by mouth every morning. 09/17/13   [provider]  Flaxseed, Linseed, (FLAX SEEDS PO) Take 1,200 mg by mouth daily.    [provider]  GARLIC OIL PO Take 1 capsule by mouth daily.    [provider]  Magnesium 500 MG CAPS Take 1 capsule by mouth daily.    [provider]  metoprolol tartrate (LOPRESSOR) 25 MG tablet Take 1 tablet (25 mg total) by mouth once for 1 dose. Take 2 hours prior to procedure. 10/16/21 05/27/23  Runell Gess, MD  Multiple Vitamin (MULTIVITAMIN WITH MINERALS) TABS tablet Take 1 tablet by mouth daily.    [provider]  omeprazole (PRILOSEC) 40 MG capsule Take 40 mg by mouth daily before breakfast. 12/29/22   [provider]  polyethylene glycol (MIRALAX / GLYCOLAX) packet Take 17 g by mouth daily.    [provider]  raloxifene (EVISTA) 60 MG tablet Take 60 mg by mouth daily.    [provider]  simvastatin (ZOCOR) 20 MG tablet Take 20 mg by mouth daily.    [provider]    Physical Exam: Vitals:   05/27/23 1516 05/27/23 1645 05/27/23 1700  BP: 100/64  (!) 148/71  Pulse: 65 65 75  Resp: 19  16  Temp: 97.7 F (36.5 C)    SpO2: 90% 98% 99%    Physical Exam Vitals and nursing note reviewed.  Constitutional:      General: She is not in acute distress.    Appearance: Normal appearance. She is not ill-appearing or toxic-appearing.  HENT:     Head: Normocephalic and atraumatic.     Nose: Nose normal.     Mouth/Throat:     Mouth: Mucous membranes are dry.     Comments: Native dentition Eyes:     Extraocular Movements: Extraocular movements intact.     Pupils: Pupils  are equal, round, and reactive to light.  Cardiovascular:     Rate and Rhythm: Normal rate and regular rhythm.     Pulses: Normal pulses.     Heart sounds: Normal heart sounds.  Pulmonary:     Effort: Pulmonary effort is normal.     Breath sounds: Normal breath sounds.  Abdominal:     General: Bowel sounds are normal.     Palpations: Abdomen is soft.  Musculoskeletal:     Cervical back: Normal range of motion.  Right lower leg: No edema.     Left lower leg: No edema.     Comments: Decreased movement left proximal leg.  Skin:    General: Skin is warm and dry.  Neurological:     General: No focal deficit present.     Mental Status: She is alert and oriented to person, place, and time. Mental status is at baseline.     Cranial Nerves: No cranial nerve deficit.  Psychiatric:        Mood and Affect: Mood normal.        Behavior: Behavior normal.      Labs on Admission: I have personally reviewed following labs and imaging studies  CBC: Recent Labs  Lab 05/27/23 1625  WBC 14.8*  NEUTROABS 12.3*  HGB 13.2  HCT 39.1  MCV 88.9  PLT 189   Basic Metabolic Panel: Recent Labs  Lab 05/27/23 1625  NA 134*  K 3.4*  CL 96*  CO2 26  GLUCOSE 146*  BUN 9  CREATININE 0.76  CALCIUM 9.0   GFR: CrCl cannot be calculated (Unknown ideal weight.). Liver Function Tests: Recent Labs  Lab 05/27/23 1625  AST 30  ALT 14  ALKPHOS 60  BILITOT 0.5  PROT 6.9  ALBUMIN 4.0   No results for input(s): "LIPASE", "AMYLASE" in the last 168 hours. No results for input(s): "AMMONIA" in the last 168 hours. Coagulation Profile: No results for input(s): "INR", "PROTIME" in the last 168 hours. Cardiac Enzymes: No results for input(s): "CKTOTAL", "CKMB", "CKMBINDEX", "TROPONINI" in the last 168 hours. BNP (last 3 results) No results for input(s): "PROBNP" in the last 8760 hours. HbA1C: No results for input(s): "HGBA1C" in the last 72 hours. CBG: No results for input(s): "GLUCAP" in the  last 168 hours. Lipid Profile: No results for input(s): "CHOL", "HDL", "LDLCALC", "TRIG", "CHOLHDL", "LDLDIRECT" in the last 72 hours. Thyroid Function Tests: No results for input(s): "TSH", "T4TOTAL", "FREET4", "T3FREE", "THYROIDAB" in the last 72 hours. Anemia Panel: No results for input(s): "VITAMINB12", "FOLATE", "FERRITIN", "TIBC", "IRON", "RETICCTPCT" in the last 72 hours. Urine analysis: No results found for: "COLORURINE", "APPEARANCEUR", "LABSPEC", "PHURINE", "GLUCOSEU", "HGBUR", "BILIRUBINUR", "KETONESUR", "PROTEINUR", "UROBILINOGEN", "NITRITE", "LEUKOCYTESUR"  Radiological Exams on Admission: I have personally reviewed images DG Hip Unilat With Pelvis 2-3 Views Left  Result Date: 05/27/2023 CLINICAL DATA:  Fall are again EXAM: DG HIP (WITH OR WITHOUT PELVIS) 2-3V LEFT COMPARISON:  None Available. FINDINGS: There is an acute fracture through the left femoral neck with mild superolateral displacement of the distal fracture fragment. There is no dislocation. Joint spaces are maintained. Lower lumbar fusion hardware is present. IMPRESSION: Acute fracture through the left femoral neck. Electronically Signed   By: Darliss Cheney M.D.   On: 05/27/2023 16:36   DG Femur 1V Left  Result Date: 05/27/2023 CLINICAL DATA:  Hip pain status post fall. EXAM: LEFT FEMUR 1 VIEW COMPARISON:  Concurrent hip radiographs. MRI of the left knee 05/17/2012. FINDINGS: AP views of the proximal and distal left thigh demonstrate an acute fracture of the left femoral neck with mild superior displacement. The distal femur appears intact as imaged in the AP projection. Probable degenerative changes at the left knee. No focal soft tissue abnormalities are identified. IMPRESSION: Acute fracture of the left femoral neck. No evidence of distal left femur injury on single AP view. If there are distal symptoms, further evaluation recommended. Electronically Signed   By: Carey Bullocks M.D.   On: 05/27/2023 16:35   DG Chest  Port 1  View  Result Date: 05/27/2023 CLINICAL DATA:  Fall. EXAM: PORTABLE CHEST 1 VIEW COMPARISON:  June 21, 2022. FINDINGS: The heart size and mediastinal contours are within normal limits. Mild central pulmonary vascular congestion is noted. No definite consolidative process is noted. The visualized skeletal structures are unremarkable. IMPRESSION: Mild central pulmonary vascular congestion. Electronically Signed   By: Lupita Raider M.D.   On: 05/27/2023 16:34   CT Head Wo Contrast  Result Date: 05/27/2023 CLINICAL DATA:  Head trauma.  Patient fell from stool. EXAM: CT HEAD WITHOUT CONTRAST TECHNIQUE: Contiguous axial images were obtained from the base of the skull through the vertex without intravenous contrast. RADIATION DOSE REDUCTION: This exam was performed according to the departmental dose-optimization program which includes automated exposure control, adjustment of the mA and/or kV according to patient size and/or use of iterative reconstruction technique. COMPARISON:  CT head without contrast 06/21/2022 FINDINGS: Brain: Mild periventricular white matter hypoattenuation is stable, left greater than right. No acute infarct, hemorrhage, or mass lesion is present. Deep brain nuclei are within normal limits. The ventricles are of normal size. No significant extraaxial fluid collection is present. The brainstem and cerebellum are within normal limits. Midline structures are within normal limits. Vascular: Atherosclerotic calcifications are present within the cavernous internal carotid arteries bilaterally. No hyperdense vessel is present. Skull: Calvarium is intact. No focal lytic or blastic lesions are present. No significant extracranial soft tissue lesion is present. Sinuses/Orbits: Secretions are present in the right sphenoid sinus. The paranasal sinuses and mastoid air cells are otherwise clear. Bilateral lens replacements are noted. Globes and orbits are otherwise unremarkable. IMPRESSION:  1. No acute intracranial abnormality or significant interval change. 2. Stable mild periventricular white matter hypoattenuation, left greater than right. This likely reflects the sequela of chronic microvascular ischemia. 3. Secretions in the right sphenoid sinus. This may reflect acute sinusitis. Electronically Signed   By: Marin Roberts M.D.   On: 05/27/2023 15:54    EKG: I have personally reviewed EKG: sinus rhythm, old anterseptal injury  Assessment/Plan Principal Problem:   Displaced intertrochanteric fracture of left femur, initial encounter for closed fracture [facility]) Active Problems:   Closed comminuted intertrochanteric fracture of left femur (HCC)   Essential hypertension   Hyperlipidemia   Laryngopharyngeal reflux (LPR)    Assessment and Plan: Closed comminuted intertrochanteric fracture of left femur (HCC) Patient had a fall with subsequent traumatic left intertrochanteric fracture femur.  Plan Pain control - ketorolac q 6                        Vicodin for uncontrolled pain  Ortho consult for ORIF with routine care to follow  Laryngopharyngeal reflux (LPR) Outpatient problem and likely source of patient's intermittent chest pain per cardiology  Plan  PPI bid while in-patient  Hyperlipidemia No lipid panel in EPIC  Plan Lipid panel in AM  Continue home medication  Essential hypertension Patient is stable.  Plan continue home regimen       DVT prophylaxis: SQ Heparin Code Status: Full Code Family Communication: husband present during interview and exam. Answered all questions  Disposition Plan: home when stable  Consults called: Ortho - Dr. Linna Caprice  Admission status: Inpatient, Med-Surg   Illene Regulus, MD [facility] 05/27/2023, 6:10 PM

## 2023-05-27 NOTE — ED Notes (Signed)
ED TO INPATIENT HANDOFF REPORT  Name/Age/Gender Amber Sloan 82 y.o. female  Code Status    Code Status Orders  (From admission, onward)           Start     Ordered   05/27/23 1803  Full code  Continuous       Question:  By:  Answer:  Consent: discussion documented in EHR   05/27/23 1809           Code Status History     This patient has a current code status but no historical code status.       Home/SNF/Other Home  Chief Complaint Displaced intertrochanteric fracture of left femur, initial encounter for closed fracture (HCC) [S72.142A]  Level of Care/Admitting Diagnosis ED Disposition     ED Disposition  Admit   Condition  --   Comment  Hospital Area: [facility] [[id number]]  Level of Care: Med-Surg [16]  May admit patient to [facility] or [facility] if equivalent level of care is available:: Yes  Covid Evaluation: Asymptomatic - no recent exposure (last 10 days) testing not required  Diagnosis: Displaced intertrochanteric fracture of left femur, initial encounter for closed fracture [facility]) [[id number]]  Admitting Physician: Jacques Navy [[id number]]  Attending Physician: Jacques Navy [[id number]]  Certification:: I certify this patient will need inpatient services for at least 2 midnights  Expected Medical Readiness: 05/31/2023          Medical History Past Medical History:  Diagnosis Date   Anginal pain (HCC)    had cardiac work up with Dr Allyson Sabal, had a normal Stress test.   Arthritis    Breast mass, right    Hx of (Dr. Parke Simmers)   Chest pain    Constipation    Diverticulitis    Dysrhythmia    Environmental allergies    Family history of heart disease    Hyperlipidemia    pure, continue on Simvastatin   Hypertension    benign, uncontrolled, continue on Benazepril-Hydrochlorothiazide   Menopause    Obesity, unspecified    Osteopenia    on Evista since 2007   Peripheral neuropathy    left   PONV (postoperative  nausea and vomiting)    Shortness of breath dyspnea    when walking upastairs    Sleep apnea    on cpap   Squamous cell carcinoma of face     Allergies Allergies  Allergen Reactions   Paraffin Other (See Comments)   Parafon Forte Dsc [Chlorzoxazone] Other (See Comments)    Old allergy unknown reaction.    Sulfa Antibiotics Nausea And Vomiting    Drugs   Sulfamethoxazole-Trimethoprim Other (See Comments)    IV Location/Drains/Wounds Patient Lines/Drains/Airways Status     Active Line/Drains/Airways     Name Placement date Placement time Site Days   Peripheral IV 05/27/23 22 G Right;Posterior Hand 05/27/23  1516  Hand  less than 1   Incision - 4 Ports Abdomen Umbilicus Superior Right;Lower Right;Mid 05/16/14  1130  -- 3298            Labs/Imaging Results for orders placed or performed during the hospital encounter of 05/27/23 (from the past 48 hour(s))  Comprehensive metabolic panel     Status: Abnormal   Collection Time: 05/27/23  4:25 PM  Result Value Ref Range   Sodium 134 (L) 135 - 145 mmol/L   Potassium 3.4 (L) 3.5 - 5.1 mmol/L   Chloride 96 (L) 98 - 111 mmol/L  CO2 26 22 - 32 mmol/L   Glucose, Bld 146 (H) 70 - 99 mg/dL    Comment: Glucose reference range applies only to samples taken after fasting for at least 8 hours.   BUN 9 8 - 23 mg/dL   Creatinine, Ser 1.61 0.44 - 1.00 mg/dL   Calcium 9.0 8.9 - 09.6 mg/dL   Total Protein 6.9 6.5 - 8.1 g/dL   Albumin 4.0 3.5 - 5.0 g/dL   AST 30 15 - 41 U/L   ALT 14 0 - 44 U/L   Alkaline Phosphatase 60 38 - 126 U/L   Total Bilirubin 0.5 <1.2 mg/dL   GFR, Estimated >04 >54 mL/min    Comment: (NOTE) Calculated using the CKD-EPI Creatinine Equation (2021)    Anion gap 12 5 - 15    Comment: Performed at [facility], [address] [place], [place] [address]  CBC with Differential     Status: Abnormal   Collection Time: 05/27/23  4:25 PM  Result Value Ref Range   WBC 14.8 (H) 4.0 - 10.5 K/uL    RBC 4.40 3.87 - 5.11 MIL/uL   Hemoglobin 13.2 12.0 - 15.0 g/dL   HCT 91.4 78.2 - 95.6 %   MCV 88.9 80.0 - 100.0 fL   MCH 30.0 26.0 - 34.0 pg   MCHC 33.8 30.0 - 36.0 g/dL   RDW 21.3 08.6 - 57.8 %   Platelets 189 150 - 400 K/uL   nRBC 0.0 0.0 - 0.2 %   Neutrophils Relative % 83 %   Neutro Abs 12.3 (H) 1.7 - 7.7 K/uL   Lymphocytes Relative 12 %   Lymphs Abs 1.8 0.7 - 4.0 K/uL   Monocytes Relative 4 %   Monocytes Absolute 0.6 0.1 - 1.0 K/uL   Eosinophils Relative 0 %   Eosinophils Absolute 0.0 0.0 - 0.5 K/uL   Basophils Relative 0 %   Basophils Absolute 0.1 0.0 - 0.1 K/uL   Immature Granulocytes 1 %   Abs Immature Granulocytes 0.07 0.00 - 0.07 K/uL    Comment: Performed at [facility], [address] [place], [place] [address]   DG Hip Unilat With Pelvis 2-3 Views Left  Result Date: 05/27/2023 CLINICAL DATA:  Fall are again EXAM: DG HIP (WITH OR WITHOUT PELVIS) 2-3V LEFT COMPARISON:  None Available. FINDINGS: There is an acute fracture through the left femoral neck with mild superolateral displacement of the distal fracture fragment. There is no dislocation. Joint spaces are maintained. Lower lumbar fusion hardware is present. IMPRESSION: Acute fracture through the left femoral neck. Electronically Signed   By: Darliss Cheney M.D.   On: 05/27/2023 16:36   DG Femur 1V Left  Result Date: 05/27/2023 CLINICAL DATA:  Hip pain status post fall. EXAM: LEFT FEMUR 1 VIEW COMPARISON:  Concurrent hip radiographs. MRI of the left knee 05/17/2012. FINDINGS: AP views of the proximal and distal left thigh demonstrate an acute fracture of the left femoral neck with mild superior displacement. The distal femur appears intact as imaged in the AP projection. Probable degenerative changes at the left knee. No focal soft tissue abnormalities are identified. IMPRESSION: Acute fracture of the left femoral neck. No evidence of distal left femur injury on single AP view. If there are distal  symptoms, further evaluation recommended. Electronically Signed   By: Carey Bullocks M.D.   On: 05/27/2023 16:35   DG Chest Port 1 View  Result Date: 05/27/2023 CLINICAL DATA:  Fall. EXAM: PORTABLE CHEST  1 VIEW COMPARISON:  June 21, 2022. FINDINGS: The heart size and mediastinal contours are within normal limits. Mild central pulmonary vascular congestion is noted. No definite consolidative process is noted. The visualized skeletal structures are unremarkable. IMPRESSION: Mild central pulmonary vascular congestion. Electronically Signed   By: Lupita Raider M.D.   On: 05/27/2023 16:34   CT Head Wo Contrast  Result Date: 05/27/2023 CLINICAL DATA:  Head trauma.  Patient fell from stool. EXAM: CT HEAD WITHOUT CONTRAST TECHNIQUE: Contiguous axial images were obtained from the base of the skull through the vertex without intravenous contrast. RADIATION DOSE REDUCTION: This exam was performed according to the departmental dose-optimization program which includes automated exposure control, adjustment of the mA and/or kV according to patient size and/or use of iterative reconstruction technique. COMPARISON:  CT head without contrast 06/21/2022 FINDINGS: Brain: Mild periventricular white matter hypoattenuation is stable, left greater than right. No acute infarct, hemorrhage, or mass lesion is present. Deep brain nuclei are within normal limits. The ventricles are of normal size. No significant extraaxial fluid collection is present. The brainstem and cerebellum are within normal limits. Midline structures are within normal limits. Vascular: Atherosclerotic calcifications are present within the cavernous internal carotid arteries bilaterally. No hyperdense vessel is present. Skull: Calvarium is intact. No focal lytic or blastic lesions are present. No significant extracranial soft tissue lesion is present. Sinuses/Orbits: Secretions are present in the right sphenoid sinus. The paranasal sinuses and mastoid  air cells are otherwise clear. Bilateral lens replacements are noted. Globes and orbits are otherwise unremarkable. IMPRESSION: 1. No acute intracranial abnormality or significant interval change. 2. Stable mild periventricular white matter hypoattenuation, left greater than right. This likely reflects the sequela of chronic microvascular ischemia. 3. Secretions in the right sphenoid sinus. This may reflect acute sinusitis. Electronically Signed   By: Marin Roberts M.D.   On: 05/27/2023 15:54    Pending Labs Unresulted Labs (From admission, onward)     Start     Ordered   05/28/23 0500  Lipid panel  Tomorrow morning,   R        05/27/23 1809   05/27/23 1808  Hemoglobin A1c  Add-on,   AD       Comments: To assess prior glycemic control    05/27/23 1809            Vitals/Pain Today's Vitals   05/27/23 1517 05/27/23 1645 05/27/23 1655 05/27/23 1700  BP:    (!) 148/71  Pulse:  65  75  Resp:    16  Temp:      SpO2:  98%  99%  PainSc: 8   7      Isolation Precautions No active isolations  Medications Medications  ondansetron (ZOFRAN) injection 4 mg (4 mg Intravenous Not Given 05/27/23 1657)  HYDROcodone-acetaminophen (NORCO/VICODIN) 5-325 MG per tablet 1-2 tablet (has no administration in time range)  heparin injection 5,000 Units (has no administration in time range)  0.45 % sodium chloride infusion (has no administration in time range)  zolpidem (AMBIEN) tablet 5 mg (has no administration in time range)  senna (SENOKOT) tablet 8.6 mg (has no administration in time range)  ketorolac (TORADOL) 30 MG/ML injection 30 mg (has no administration in time range)  insulin aspart (novoLOG) injection 0-15 Units (has no administration in time range)  insulin aspart (novoLOG) injection 0-5 Units (has no administration in time range)  lactated ringers bolus 1,000 mL (1,000 mLs Intravenous New Bag/Given 05/27/23 1631)  fentaNYL (SUBLIMAZE) injection 50  mcg (50 mcg Intravenous Given  05/27/23 1627)    Mobility walks

## 2023-05-27 NOTE — Assessment & Plan Note (Signed)
Outpatient problem and likely source of patient's intermittent chest pain per cardiology  Plan  PPI bid while in-patient

## 2023-05-27 NOTE — Assessment & Plan Note (Signed)
Patient had a fall with subsequent traumatic left intertrochanteric fracture femur.  Plan Pain control - ketorolac q 6                        Vicodin for uncontrolled pain  Ortho consult for ORIF with routine care to follow

## 2023-05-27 NOTE — Assessment & Plan Note (Signed)
Patient is stable.  Plan continue home regimen

## 2023-05-27 NOTE — Plan of Care (Signed)
  Problem: Clinical Measurements: Goal: Ability to maintain clinical measurements within normal limits will improve Outcome: Progressing   Problem: Elimination: Goal: Will not experience complications related to urinary retention Outcome: Progressing   Problem: Safety: Goal: Ability to remain free from injury will improve Outcome: Progressing   Problem: Education: Goal: Verbalization of understanding the information provided (i.e., activity precautions, restrictions, etc) will improve Outcome: Progressing   Problem: Pain Management: Goal: Pain level will decrease Outcome: Progressing

## 2023-05-27 NOTE — Assessment & Plan Note (Signed)
No lipid panel in EPIC  Plan Lipid panel in AM  Continue home medication

## 2023-05-27 NOTE — ED Triage Notes (Signed)
BIBA after falling off of a stool, injuring left hip and leg. Pt is unable to straighten left leg- 100 mcg Fentanyl, 8 mg Zofran given PTA

## 2023-05-27 NOTE — ED Notes (Signed)
Patient transported to CT 

## 2023-05-27 NOTE — Subjective & Objective (Signed)
Amber Sloan is a 82 y.o. female with a hx of hypertension, hyperlipidemia, strong family history of heart disease and intermittent chest pain, colitis, diverticulitis, gallbladder disease and is prediabetic. She waslast seen by her cardiologist 01/26/23: chest pain was attributed to GERD. She had a CTA coronaries - minimal to mild ASVD. She was dining at home and fell off of a stool landing on her left hip. She had immediate pain and could not bear weight. She presents to [facility] for evaluation.

## 2023-05-28 ENCOUNTER — Inpatient Hospital Stay (HOSPITAL_COMMUNITY): Payer: PPO | Admitting: Anesthesiology

## 2023-05-28 ENCOUNTER — Inpatient Hospital Stay (HOSPITAL_COMMUNITY): Payer: PPO

## 2023-05-28 ENCOUNTER — Other Ambulatory Visit: Payer: Self-pay

## 2023-05-28 ENCOUNTER — Encounter (HOSPITAL_COMMUNITY)
Admission: EM | Disposition: A | Discharge status: Home Health | Payer: Self-pay | Source: Home / Self Care | Attending: Internal Medicine

## 2023-05-28 DIAGNOSIS — S72142A Displaced intertrochanteric fracture of left femur, initial encounter for closed fracture: Secondary | ICD-10-CM | POA: Diagnosis not present

## 2023-05-28 DIAGNOSIS — I10 Essential (primary) hypertension: Secondary | ICD-10-CM | POA: Diagnosis not present

## 2023-05-28 DIAGNOSIS — G4730 Sleep apnea, unspecified: Secondary | ICD-10-CM | POA: Diagnosis not present

## 2023-05-28 DIAGNOSIS — S72002A Fracture of unspecified part of neck of left femur, initial encounter for closed fracture: Secondary | ICD-10-CM

## 2023-05-28 DIAGNOSIS — E785 Hyperlipidemia, unspecified: Secondary | ICD-10-CM | POA: Diagnosis not present

## 2023-05-28 HISTORY — PX: TOTAL HIP ARTHROPLASTY: SHX124

## 2023-05-28 LAB — LIPID PANEL
Cholesterol: 163 mg/dL (ref 0–200)
HDL: 60 mg/dL (ref >40)
LDL Cholesterol: 75 mg/dL (ref 0–99)
Total CHOL/HDL Ratio: 2.7 {ratio}
Triglycerides: 142 mg/dL (ref <150)
VLDL: 28 mg/dL (ref 0–40)

## 2023-05-28 LAB — SURGICAL PCR SCREEN
MRSA, PCR: NEGATIVE
Staphylococcus aureus: NEGATIVE

## 2023-05-28 LAB — GLUCOSE, CAPILLARY
Glucose-Capillary: 97 mg/dL (ref 70–99)
Glucose-Capillary: 197 mg/dL — ABNORMAL HIGH (ref 70–99)
Glucose-Capillary: 157 mg/dL — ABNORMAL HIGH (ref 70–99)

## 2023-05-28 SURGERY — ARTHROPLASTY, HIP, TOTAL, ANTERIOR APPROACH
Anesthesia: General | Site: Hip | Laterality: Left

## 2023-05-28 MED ORDER — ASPIRIN 81 MG PO TBEC
81.0000 mg | DELAYED_RELEASE_TABLET | Freq: Every morning | ORAL | Status: DC
  Administered 2023-05-29 – 2023-05-30 (×2): 81 mg via ORAL
  Filled 2023-05-28 (×2): qty 1

## 2023-05-28 MED ORDER — BENAZEPRIL-HYDROCHLOROTHIAZIDE 10-12.5 MG PO TABS: 1.0000 | ORAL_TABLET | Freq: Every morning | ORAL | Status: DC

## 2023-05-28 MED ORDER — LACTATED RINGERS IV SOLN: INTRAVENOUS | Status: DC | PRN

## 2023-05-28 MED ORDER — HYDROCODONE-ACETAMINOPHEN 7.5-325 MG PO TABS: 1.0000 | ORAL_TABLET | ORAL | Status: DC | PRN

## 2023-05-28 MED ORDER — CEFAZOLIN SODIUM-DEXTROSE 2-4 GM/100ML-% IV SOLN
2.0000 g | INTRAVENOUS | Status: AC
  Administered 2023-05-28: 2 g via INTRAVENOUS

## 2023-05-28 MED ORDER — PHENYLEPHRINE HCL-NACL 20-0.9 MG/250ML-% IV SOLN
INTRAVENOUS | Status: AC
  Filled 2023-05-28: qty 250

## 2023-05-28 MED ORDER — VITAMIN D3 25 MCG (1000 UNIT) PO TABS
2000.0000 [IU] | ORAL_TABLET | Freq: Every morning | ORAL | Status: DC
  Administered 2023-05-29 – 2023-05-30 (×2): 2000 [IU] via ORAL
  Filled 2023-05-28 (×4): qty 2

## 2023-05-28 MED ORDER — ACETAMINOPHEN 500 MG PO TABS: 1000.0000 mg | ORAL_TABLET | Freq: Every day | ORAL | Status: DC

## 2023-05-28 MED ORDER — TRANEXAMIC ACID-NACL 1000-0.7 MG/100ML-% IV SOLN
INTRAVENOUS | Status: AC
  Filled 2023-05-28: qty 100

## 2023-05-28 MED ORDER — DEXAMETHASONE SODIUM PHOSPHATE 10 MG/ML IJ SOLN
10.0000 mg | Freq: Once | INTRAMUSCULAR | Status: AC
  Administered 2023-05-29: 10 mg via INTRAVENOUS
  Filled 2023-05-28: qty 1

## 2023-05-28 MED ORDER — LIDOCAINE HCL (PF) 2 % IJ SOLN
INTRAMUSCULAR | Status: AC
Start: 2023-05-28 — End: ?
  Filled 2023-05-28: qty 5

## 2023-05-28 MED ORDER — ROCURONIUM BROMIDE 10 MG/ML (PF) SYRINGE
PREFILLED_SYRINGE | INTRAVENOUS | Status: DC | PRN
  Administered 2023-05-28: 40 mg via INTRAVENOUS

## 2023-05-28 MED ORDER — BUPIVACAINE HCL (PF) 0.25 % IJ SOLN
INTRAMUSCULAR | Status: DC | PRN
  Administered 2023-05-28: 30 mL

## 2023-05-28 MED ORDER — FENTANYL CITRATE (PF) 100 MCG/2ML IJ SOLN
INTRAMUSCULAR | Status: AC
  Filled 2023-05-28: qty 2

## 2023-05-28 MED ORDER — FENTANYL CITRATE PF 50 MCG/ML IJ SOSY
25.0000 ug | PREFILLED_SYRINGE | INTRAMUSCULAR | Status: DC | PRN
  Administered 2023-05-28: 50 ug via INTRAVENOUS

## 2023-05-28 MED ORDER — ONDANSETRON HCL 4 MG/2ML IJ SOLN
INTRAMUSCULAR | Status: DC | PRN
  Administered 2023-05-28: 4 mg via INTRAVENOUS

## 2023-05-28 MED ORDER — ONDANSETRON HCL 4 MG/2ML IJ SOLN: 4.0000 mg | Freq: Once | INTRAMUSCULAR | Status: DC | PRN

## 2023-05-28 MED ORDER — POLYETHYLENE GLYCOL 3350 17 G PO PACK: 17.0000 g | PACK | Freq: Every day | ORAL | Status: DC | PRN

## 2023-05-28 MED ORDER — BENAZEPRIL HCL 20 MG PO TABS
10.0000 mg | ORAL_TABLET | Freq: Every day | ORAL | Status: DC
  Administered 2023-05-29: 10 mg via ORAL
  Filled 2023-05-28: qty 1

## 2023-05-28 MED ORDER — MORPHINE SULFATE (PF) 2 MG/ML IV SOLN
0.5000 mg | INTRAVENOUS | Status: DC | PRN
  Administered 2023-05-28: 1 mg via INTRAVENOUS
  Filled 2023-05-28: qty 1

## 2023-05-28 MED ORDER — ACETAMINOPHEN 10 MG/ML IV SOLN
1000.0000 mg | Freq: Once | INTRAVENOUS | Status: DC | PRN
  Administered 2023-05-28: 1000 mg via INTRAVENOUS

## 2023-05-28 MED ORDER — MORPHINE SULFATE (PF) 2 MG/ML IV SOLN
2.0000 mg | INTRAVENOUS | Status: DC | PRN
  Administered 2023-05-28: 2 mg via INTRAVENOUS
  Filled 2023-05-28: qty 1

## 2023-05-28 MED ORDER — DIPHENHYDRAMINE HCL 12.5 MG/5ML PO ELIX: 12.5000 mg | ORAL_SOLUTION | ORAL | Status: DC | PRN

## 2023-05-28 MED ORDER — TRANEXAMIC ACID-NACL 1000-0.7 MG/100ML-% IV SOLN
1000.0000 mg | INTRAVENOUS | Status: AC
  Administered 2023-05-28: 1000 mg via INTRAVENOUS

## 2023-05-28 MED ORDER — SIMVASTATIN 20 MG PO TABS
20.0000 mg | ORAL_TABLET | Freq: Every day | ORAL | Status: DC
  Administered 2023-05-28 – 2023-05-29 (×2): 20 mg via ORAL
  Filled 2023-05-28 (×2): qty 1

## 2023-05-28 MED ORDER — MENTHOL 3 MG MT LOZG: 1.0000 | LOZENGE | OROMUCOSAL | Status: DC | PRN

## 2023-05-28 MED ORDER — PROPOFOL 10 MG/ML IV BOLUS
INTRAVENOUS | Status: DC | PRN
  Administered 2023-05-28: 100 mg via INTRAVENOUS

## 2023-05-28 MED ORDER — HYDROCHLOROTHIAZIDE 12.5 MG PO TABS
12.5000 mg | ORAL_TABLET | Freq: Every day | ORAL | Status: DC
  Administered 2023-05-29: 12.5 mg via ORAL
  Filled 2023-05-28: qty 1

## 2023-05-28 MED ORDER — EPHEDRINE SULFATE-NACL 50-0.9 MG/10ML-% IV SOSY
PREFILLED_SYRINGE | INTRAVENOUS | Status: DC | PRN
Start: 2023-05-28 — End: 2023-05-28
  Administered 2023-05-28: 10 mg via INTRAVENOUS

## 2023-05-28 MED ORDER — BUPIVACAINE HCL 0.25 % IJ SOLN
INTRAMUSCULAR | Status: AC
  Filled 2023-05-28: qty 1

## 2023-05-28 MED ORDER — ACETAMINOPHEN 10 MG/ML IV SOLN
INTRAVENOUS | Status: AC
  Filled 2023-05-28: qty 100

## 2023-05-28 MED ORDER — SODIUM CHLORIDE 0.9 % IV SOLN: INTRAVENOUS | Status: DC

## 2023-05-28 MED ORDER — METHOCARBAMOL 1000 MG/10ML IJ SOLN
INTRAMUSCULAR | Status: AC
  Administered 2023-05-28: 1000 mg
  Filled 2023-05-28: qty 10

## 2023-05-28 MED ORDER — LIDOCAINE 2% (20 MG/ML) 5 ML SYRINGE
INTRAMUSCULAR | Status: DC | PRN
  Administered 2023-05-28: 60 mg via INTRAVENOUS
  Administered 2023-05-28: 20 mg via INTRAVENOUS

## 2023-05-28 MED ORDER — AMISULPRIDE (ANTIEMETIC) 5 MG/2ML IV SOLN: 10.0000 mg | Freq: Once | INTRAVENOUS | Status: DC | PRN

## 2023-05-28 MED ORDER — ZINC SULFATE 220 (50 ZN) MG PO CAPS
220.0000 mg | ORAL_CAPSULE | Freq: Every morning | ORAL | Status: DC
  Administered 2023-05-29 – 2023-05-30 (×2): 220 mg via ORAL
  Filled 2023-05-28 (×2): qty 1

## 2023-05-28 MED ORDER — FLEET ENEMA RE ENEM: 1.0000 | ENEMA | Freq: Once | RECTAL | Status: DC | PRN

## 2023-05-28 MED ORDER — ROCURONIUM BROMIDE 10 MG/ML (PF) SYRINGE
PREFILLED_SYRINGE | INTRAVENOUS | Status: AC
  Filled 2023-05-28: qty 10

## 2023-05-28 MED ORDER — WATER FOR IRRIGATION, STERILE IR SOLN
Status: DC | PRN
  Administered 2023-05-28: 1000 mL

## 2023-05-28 MED ORDER — ACETAMINOPHEN 325 MG PO TABS: 650.0000 mg | ORAL_TABLET | Freq: Four times a day (QID) | ORAL | Status: DC | PRN

## 2023-05-28 MED ORDER — APIXABAN 2.5 MG PO TABS
2.5000 mg | ORAL_TABLET | Freq: Two times a day (BID) | ORAL | Status: DC
  Administered 2023-05-29 – 2023-05-30 (×3): 2.5 mg via ORAL
  Filled 2023-05-28 (×3): qty 1

## 2023-05-28 MED ORDER — 0.9 % SODIUM CHLORIDE (POUR BTL) OPTIME
TOPICAL | Status: DC | PRN
  Administered 2023-05-28: 1000 mL

## 2023-05-28 MED ORDER — METOCLOPRAMIDE HCL 5 MG PO TABS
5.0000 mg | ORAL_TABLET | Freq: Three times a day (TID) | ORAL | Status: DC | PRN
  Filled 2023-05-28: qty 2

## 2023-05-28 MED ORDER — SUGAMMADEX SODIUM 200 MG/2ML IV SOLN
INTRAVENOUS | Status: DC | PRN
  Administered 2023-05-28: 200 mg via INTRAVENOUS

## 2023-05-28 MED ORDER — ACETAMINOPHEN 325 MG PO TABS: 325.0000 mg | ORAL_TABLET | Freq: Four times a day (QID) | ORAL | Status: DC | PRN

## 2023-05-28 MED ORDER — ONDANSETRON HCL 4 MG/2ML IJ SOLN: 4.0000 mg | Freq: Four times a day (QID) | INTRAMUSCULAR | Status: DC | PRN

## 2023-05-28 MED ORDER — BISACODYL 10 MG RE SUPP: 10.0000 mg | Freq: Every day | RECTAL | Status: DC | PRN

## 2023-05-28 MED ORDER — PROPOFOL 10 MG/ML IV BOLUS
INTRAVENOUS | Status: AC
  Filled 2023-05-28: qty 20

## 2023-05-28 MED ORDER — FENTANYL CITRATE PF 50 MCG/ML IJ SOSY
PREFILLED_SYRINGE | INTRAMUSCULAR | Status: AC
  Filled 2023-05-28: qty 3

## 2023-05-28 MED ORDER — RALOXIFENE HCL 60 MG PO TABS
60.0000 mg | ORAL_TABLET | Freq: Every morning | ORAL | Status: DC
  Administered 2023-05-29 – 2023-05-30 (×2): 60 mg via ORAL
  Filled 2023-05-28 (×2): qty 1

## 2023-05-28 MED ORDER — PHENYLEPHRINE HCL-NACL 20-0.9 MG/250ML-% IV SOLN
INTRAVENOUS | Status: DC | PRN
  Administered 2023-05-28: 50 ug/min via INTRAVENOUS

## 2023-05-28 MED ORDER — FENTANYL CITRATE (PF) 100 MCG/2ML IJ SOLN
INTRAMUSCULAR | Status: DC | PRN
  Administered 2023-05-28 (×4): 50 ug via INTRAVENOUS

## 2023-05-28 MED ORDER — DOCUSATE SODIUM 100 MG PO CAPS
100.0000 mg | ORAL_CAPSULE | Freq: Two times a day (BID) | ORAL | Status: DC
  Administered 2023-05-28 – 2023-05-29 (×2): 100 mg via ORAL
  Filled 2023-05-28 (×2): qty 1

## 2023-05-28 MED ORDER — PANTOPRAZOLE SODIUM 40 MG PO TBEC
80.0000 mg | DELAYED_RELEASE_TABLET | Freq: Every day | ORAL | Status: DC
  Administered 2023-05-29 – 2023-05-30 (×2): 80 mg via ORAL
  Filled 2023-05-28 (×2): qty 2

## 2023-05-28 MED ORDER — ONDANSETRON HCL 4 MG/2ML IJ SOLN
INTRAMUSCULAR | Status: AC
Start: 2023-05-28 — End: ?
  Filled 2023-05-28: qty 2

## 2023-05-28 MED ORDER — METHOCARBAMOL 500 MG PO TABS
500.0000 mg | ORAL_TABLET | Freq: Three times a day (TID) | ORAL | Status: DC | PRN
  Administered 2023-05-30: 500 mg via ORAL
  Filled 2023-05-28 (×2): qty 1

## 2023-05-28 MED ORDER — MAGNESIUM OXIDE -MG SUPPLEMENT 400 (240 MG) MG PO TABS
400.0000 mg | ORAL_TABLET | Freq: Every day | ORAL | Status: DC
  Administered 2023-05-29 – 2023-05-30 (×2): 400 mg via ORAL
  Filled 2023-05-28 (×2): qty 1

## 2023-05-28 MED ORDER — HYDROCODONE-ACETAMINOPHEN 5-325 MG PO TABS: 1.0000 | ORAL_TABLET | Freq: Four times a day (QID) | ORAL | Status: DC | PRN

## 2023-05-28 MED ORDER — CEFAZOLIN SODIUM-DEXTROSE 2-4 GM/100ML-% IV SOLN
INTRAVENOUS | Status: AC
  Filled 2023-05-28: qty 100

## 2023-05-28 MED ORDER — DEXAMETHASONE SODIUM PHOSPHATE 10 MG/ML IJ SOLN
INTRAMUSCULAR | Status: DC | PRN
  Administered 2023-05-28: 8 mg via INTRAVENOUS

## 2023-05-28 MED ORDER — DEXAMETHASONE SODIUM PHOSPHATE 10 MG/ML IJ SOLN
INTRAMUSCULAR | Status: AC
  Filled 2023-05-28: qty 1

## 2023-05-28 MED ORDER — CEFAZOLIN (ANCEF) 1 G IV SOLR: 2.0000 g | INTRAVENOUS | Status: DC

## 2023-05-28 MED ORDER — METOCLOPRAMIDE HCL 5 MG/ML IJ SOLN: 5.0000 mg | Freq: Three times a day (TID) | INTRAMUSCULAR | Status: DC | PRN

## 2023-05-28 MED ORDER — HYDROCODONE-ACETAMINOPHEN 5-325 MG PO TABS
1.0000 | ORAL_TABLET | ORAL | Status: DC | PRN
  Administered 2023-05-28: 1 via ORAL
  Administered 2023-05-29: 2 via ORAL
  Administered 2023-05-29 – 2023-05-30 (×5): 1 via ORAL
  Filled 2023-05-28 (×2): qty 1
  Filled 2023-05-28: qty 2
  Filled 2023-05-28: qty 1
  Filled 2023-05-28: qty 2
  Filled 2023-05-28 (×2): qty 1

## 2023-05-28 MED ORDER — PHENOL 1.4 % MT LIQD: 1.0000 | OROMUCOSAL | Status: DC | PRN

## 2023-05-28 MED ORDER — DOCUSATE SODIUM 100 MG PO CAPS: 100.0000 mg | ORAL_CAPSULE | Freq: Two times a day (BID) | ORAL | Status: DC

## 2023-05-28 SURGICAL SUPPLY — 35 items
BAG COUNTER SPONGE SURGICOUNT (BAG) IMPLANT
BAG ZIPLOCK 12X15 (MISCELLANEOUS) IMPLANT
BLADE SAG 18X100X1.27 (BLADE) ×1 IMPLANT
COVER PERINEAL POST (MISCELLANEOUS) ×1 IMPLANT
COVER SURGICAL LIGHT HANDLE (MISCELLANEOUS) ×1 IMPLANT
CUP ACETBLR 48 OD SECTOR II (Hips) IMPLANT
DERMABOND ADVANCED .7 DNX12 (GAUZE/BANDAGES/DRESSINGS) ×1 IMPLANT
DRAPE FOOT SWITCH (DRAPES) ×1 IMPLANT
DRAPE STERI IOBAN 125X83 (DRAPES) ×1 IMPLANT
DRAPE U-SHAPE 47X51 STRL (DRAPES) ×2 IMPLANT
DRSG AQUACEL AG ADV 3.5X10 (GAUZE/BANDAGES/DRESSINGS) ×1 IMPLANT
DURAPREP 26ML APPLICATOR (WOUND CARE) ×1 IMPLANT
ELECT REM PT RETURN 15FT ADLT (MISCELLANEOUS) ×1 IMPLANT
GLOVE BIO SURGEON STRL SZ 6.5 (GLOVE) IMPLANT
GLOVE BIO SURGEON STRL SZ8 (GLOVE) ×1 IMPLANT
GLOVE BIOGEL PI IND STRL 6.5 (GLOVE) IMPLANT
GLOVE BIOGEL PI IND STRL 7.0 (GLOVE) IMPLANT
GLOVE BIOGEL PI IND STRL 8 (GLOVE) ×1 IMPLANT
GOWN STRL REUS W/ TWL LRG LVL3 (GOWN DISPOSABLE) ×1 IMPLANT
HEAD FEM STD 28X+1.5 STRL (Hips) IMPLANT
HOLDER FOLEY CATH W/STRAP (MISCELLANEOUS) ×1 IMPLANT
KIT TURNOVER KIT A (KITS) IMPLANT
LINER MARATHON 28 48 (Hips) IMPLANT
MANIFOLD NEPTUNE II (INSTRUMENTS) ×1 IMPLANT
PACK ANTERIOR HIP CUSTOM (KITS) ×1 IMPLANT
PENCIL SMOKE EVACUATOR COATED (MISCELLANEOUS) ×1 IMPLANT
SPIKE FLUID TRANSFER (MISCELLANEOUS) ×1 IMPLANT
STEM FEMORAL SZ 6MM STD ACTIS (Stem) IMPLANT
SUT ETHIBOND NAB CT1 #1 30IN (SUTURE) ×1 IMPLANT
SUT MNCRL AB 4-0 PS2 18 (SUTURE) ×1 IMPLANT
SUT STRATAFIX 0 PDS 27 VIOLET (SUTURE) ×1 IMPLANT
SUT VIC AB 2-0 CT1 TAPERPNT 27 (SUTURE) ×2 IMPLANT
SUTURE STRATFX 0 PDS 27 VIOLET (SUTURE) ×1 IMPLANT
TRAY FOLEY MTR SLVR 16FR STAT (SET/KITS/TRAYS/PACK) ×1 IMPLANT
TUBE SUCTION HIGH CAP CLEAR NV (SUCTIONS) ×1 IMPLANT

## 2023-05-28 NOTE — Consult Note (Signed)
Reason for Consult:Left femoral neck fracture Referring Physician: Dr Oretha Milch Amber Sloan is an 82 y.o. female.  HPI: Amber Sloan is a 81 yo female who had a mechanical fall at home yesterday off a swiveling stool and landed on her left hip with immediate pain and inability to get up. Amber Sloan did not have any dizziness or light headedness which caused the fall. Amber Sloan did not hit her head or have a loss of consciousness. Amber Sloan has chronic low back pain but no history of prior left hip problems. Her only acute complaint is left hip pain. Amber Sloan is not having any LLE numbness or weakness. Amber Sloan was unable to get up or ambulate yesterday after the fall and was taken to the [facility] ED where x-rays showed a displaced left femoral neck fracture. Amber Sloan was admitted to the hospitalist service and we were consulted for management  Past Medical History:  Diagnosis Date   Anginal pain (HCC)    had cardiac work up with Dr Allyson Sabal, had a normal Stress test.   Arthritis    Breast mass, right    Hx of (Dr. Parke Simmers)   Chest pain    Constipation    Diverticulitis    Dysrhythmia    Environmental allergies    Family history of heart disease    Hyperlipidemia    pure, continue on Simvastatin   Hypertension    benign, uncontrolled, continue on Benazepril-Hydrochlorothiazide   Menopause    Obesity, unspecified    Osteopenia    on Evista since 2007   Peripheral neuropathy    left   PONV (postoperative nausea and vomiting)    Shortness of breath dyspnea    when walking upastairs    Sleep apnea    on cpap   Squamous cell carcinoma of face     Past Surgical History:  Procedure Laterality Date   ABDOMINAL HYSTERECTOMY  1986   still has Ovaries   ABDOMINAL HYSTERECTOMY     BACK SURGERY     BREAST EXCISIONAL BIOPSY Right    BREAST FIBROADENOMA SURGERY  03/1997   Right breast   CHOLECYSTECTOMY N/A 05/16/2014   Procedure: LAPAROSCOPIC CHOLECYSTECTOMY WITH INTRAOPERATIVE CHOLANGIOGRAM;  Surgeon: Avel Peace, MD;   Location: [facility] OR;  Service: General;  Laterality: N/A;   COLONOSCOPY     EVALUATION UNDER ANESTHESIA WITH STRABISMUS REPAIR     LUMBAR FUSION  10/2005   5,    TONSILLECTOMY     TONSILLECTOMY AND ADENOIDECTOMY      Family History  Problem Relation Age of Onset   Hypertension Mother    Coronary artery disease Mother    Hypertension Father    Coronary artery disease Father    Hypertension Brother    CVA Brother    Hyperlipidemia Brother    Diabetes Mellitus II Brother    Hypertension Brother    CVA Brother    Hyperlipidemia Brother    Diabetes Mellitus II Brother    Hypertension Brother    CVA Brother    Hyperlipidemia Brother    Diabetes Mellitus II Brother     Social History:  reports that Amber Sloan has never smoked. Amber Sloan has never used smokeless tobacco. Amber Sloan reports that Amber Sloan does not drink alcohol and does not use drugs.  Allergies:  Allergies  Allergen Reactions   Tape Other (See Comments)    Band-Aids are tolerated, but tape applied in the hospital after gall bladder surgery broke out the skin BADLY (feels only paper tape is safe)  Parafon Forte Dsc [Chlorzoxazone] Other (See Comments)    Old allergy unknown reaction   Sulfa Antibiotics Nausea And Vomiting    Medications: I have reviewed the patient's current medications.  Results for orders placed or performed during the hospital encounter of 05/27/23 (from the past 48 hour(s))  Comprehensive metabolic panel     Status: Abnormal   Collection Time: 05/27/23  4:25 PM  Result Value Ref Range   Sodium 134 (L) 135 - 145 mmol/L   Potassium 3.4 (L) 3.5 - 5.1 mmol/L   Chloride 96 (L) 98 - 111 mmol/L   CO2 26 22 - 32 mmol/L   Glucose, Bld 146 (H) 70 - 99 mg/dL    Comment: Glucose reference range applies only to samples taken after fasting for at least 8 hours.   BUN 9 8 - 23 mg/dL   Creatinine, Ser 1.61 0.44 - 1.00 mg/dL   Calcium 9.0 8.9 - 09.6 mg/dL   Total Protein 6.9 6.5 - 8.1 g/dL   Albumin 4.0 3.5 - 5.0 g/dL   AST 30  15 - 41 U/L   ALT 14 0 - 44 U/L   Alkaline Phosphatase 60 38 - 126 U/L   Total Bilirubin 0.5 <1.2 mg/dL   GFR, Estimated >04 >54 mL/min    Comment: (NOTE) Calculated using the CKD-EPI Creatinine Equation (2021)    Anion gap 12 5 - 15    Comment: Performed at [facility], [address] [place], [place] [address]  CBC with Differential     Status: Abnormal   Collection Time: 05/27/23  4:25 PM  Result Value Ref Range   WBC 14.8 (H) 4.0 - 10.5 K/uL   RBC 4.40 3.87 - 5.11 MIL/uL   Hemoglobin 13.2 12.0 - 15.0 g/dL   HCT 91.4 78.2 - 95.6 %   MCV 88.9 80.0 - 100.0 fL   MCH 30.0 26.0 - 34.0 pg   MCHC 33.8 30.0 - 36.0 g/dL   RDW 21.3 08.6 - 57.8 %   Platelets 189 150 - 400 K/uL   nRBC 0.0 0.0 - 0.2 %   Neutrophils Relative % 83 %   Neutro Abs 12.3 (H) 1.7 - 7.7 K/uL   Lymphocytes Relative 12 %   Lymphs Abs 1.8 0.7 - 4.0 K/uL   Monocytes Relative 4 %   Monocytes Absolute 0.6 0.1 - 1.0 K/uL   Eosinophils Relative 0 %   Eosinophils Absolute 0.0 0.0 - 0.5 K/uL   Basophils Relative 0 %   Basophils Absolute 0.1 0.0 - 0.1 K/uL   Immature Granulocytes 1 %   Abs Immature Granulocytes 0.07 0.00 - 0.07 K/uL    Comment: Performed at [facility], [address], [place], [place] [address]  Hemoglobin A1c     Status: Abnormal   Collection Time: 05/27/23  4:25 PM  Result Value Ref Range   Hgb A1c MFr Bld 5.8 (H) 4.8 - 5.6 %    Comment: (NOTE) Pre diabetes:          5.7%-6.4%  Diabetes:              >6.4%  Glycemic control for   <7.0% adults with diabetes    Mean Plasma Glucose 119.76 mg/dL    Comment: Performed at [facility], [address], [place], [place] [address]  Glucose, capillary     Status: Abnormal   Collection Time: 05/27/23  9:54 PM  Result Value Ref Range   Glucose-Capillary 175 (H)  70 - 99 mg/dL    Comment: Glucose reference range applies only to samples taken after fasting for at least 8 hours.  Surgical PCR screen      Status: None   Collection Time: 05/27/23 11:20 PM   Specimen: Nasal Mucosa; Nasal Swab  Result Value Ref Range   MRSA, PCR NEGATIVE NEGATIVE   Staphylococcus aureus NEGATIVE NEGATIVE    Comment: (NOTE) The Xpert SA Assay (FDA approved for NASAL specimens in patients 46 years of age and older), is one component of a comprehensive surveillance program. It is not intended to diagnose infection nor to guide or monitor treatment. Performed at [facility], [address] [place], [place] [address]   Lipid panel     Status: None   Collection Time: 05/28/23  3:57 AM  Result Value Ref Range   Cholesterol 163 0 - 200 mg/dL   Triglycerides 657 <846 mg/dL   HDL 60 >96 mg/dL   Total CHOL/HDL Ratio 2.7 RATIO   VLDL 28 0 - 40 mg/dL   LDL Cholesterol 75 0 - 99 mg/dL    Comment:        Total Cholesterol/HDL:CHD Risk Coronary Heart Disease Risk Table                     Men   Women  1/2 Average Risk   3.4   3.3  Average Risk       5.0   4.4  2 X Average Risk   9.6   7.1  3 X Average Risk  23.4   11.0        Use the calculated Patient Ratio above and the CHD Risk Table to determine the patient's CHD Risk.        ATP III CLASSIFICATION (LDL):  <100     mg/dL   Optimal  295-284  mg/dL   Near or Above                    Optimal  130-159  mg/dL   Borderline  132-440  mg/dL   High  >102     mg/dL   Very High Performed at [facility], [address], [place], [place] [address]   Glucose, capillary     Status: None   Collection Time: 05/28/23  8:00 AM  Result Value Ref Range   Glucose-Capillary 97 70 - 99 mg/dL    Comment: Glucose reference range applies only to samples taken after fasting for at least 8 hours.    DG Hip Unilat With Pelvis 2-3 Views Left  Result Date: 05/27/2023 CLINICAL DATA:  Fall are again EXAM: DG HIP (WITH OR WITHOUT PELVIS) 2-3V LEFT COMPARISON:  None Available. FINDINGS: There is an acute fracture through the left femoral  neck with mild superolateral displacement of the distal fracture fragment. There is no dislocation. Joint spaces are maintained. Lower lumbar fusion hardware is present. IMPRESSION: Acute fracture through the left femoral neck. Electronically Signed   By: Darliss Cheney M.D.   On: 05/27/2023 16:36   DG Femur 1V Left  Result Date: 05/27/2023 CLINICAL DATA:  Hip pain status post fall. EXAM: LEFT FEMUR 1 VIEW COMPARISON:  Concurrent hip radiographs. MRI of the left knee 05/17/2012. FINDINGS: AP views of the proximal and distal left thigh demonstrate an acute fracture of the left femoral neck with mild superior displacement. The distal femur appears intact as imaged in the AP projection. Probable degenerative changes at  the left knee. No focal soft tissue abnormalities are identified. IMPRESSION: Acute fracture of the left femoral neck. No evidence of distal left femur injury on single AP view. If there are distal symptoms, further evaluation recommended. Electronically Signed   By: Carey Bullocks M.D.   On: 05/27/2023 16:35   DG Chest Port 1 View  Result Date: 05/27/2023 CLINICAL DATA:  Fall. EXAM: PORTABLE CHEST 1 VIEW COMPARISON:  June 21, 2022. FINDINGS: The heart size and mediastinal contours are within normal limits. Mild central pulmonary vascular congestion is noted. No definite consolidative process is noted. The visualized skeletal structures are unremarkable. IMPRESSION: Mild central pulmonary vascular congestion. Electronically Signed   By: Lupita Raider M.D.   On: 05/27/2023 16:34   CT Head Wo Contrast  Result Date: 05/27/2023 CLINICAL DATA:  Head trauma.  Patient fell from stool. EXAM: CT HEAD WITHOUT CONTRAST TECHNIQUE: Contiguous axial images were obtained from the base of the skull through the vertex without intravenous contrast. RADIATION DOSE REDUCTION: This exam was performed according to the departmental dose-optimization program which includes automated exposure control,  adjustment of the mA and/or kV according to patient size and/or use of iterative reconstruction technique. COMPARISON:  CT head without contrast 06/21/2022 FINDINGS: Brain: Mild periventricular white matter hypoattenuation is stable, left greater than right. No acute infarct, hemorrhage, or mass lesion is present. Deep brain nuclei are within normal limits. The ventricles are of normal size. No significant extraaxial fluid collection is present. The brainstem and cerebellum are within normal limits. Midline structures are within normal limits. Vascular: Atherosclerotic calcifications are present within the cavernous internal carotid arteries bilaterally. No hyperdense vessel is present. Skull: Calvarium is intact. No focal lytic or blastic lesions are present. No significant extracranial soft tissue lesion is present. Sinuses/Orbits: Secretions are present in the right sphenoid sinus. The paranasal sinuses and mastoid air cells are otherwise clear. Bilateral lens replacements are noted. Globes and orbits are otherwise unremarkable. IMPRESSION: 1. No acute intracranial abnormality or significant interval change. 2. Stable mild periventricular white matter hypoattenuation, left greater than right. This likely reflects the sequela of chronic microvascular ischemia. 3. Secretions in the right sphenoid sinus. This may reflect acute sinusitis. Electronically Signed   By: Marin Roberts M.D.   On: 05/27/2023 15:54    Review of Systems Blood pressure 120/73, pulse 70, temperature 98.4 F (36.9 C), resp. rate 16, height 5' 3.5" (1.613 m), weight 72 kg, SpO2 98%. Physical Exam Physical Examination: General appearance - alert, well appearing, and in no distress Mental status - alert, oriented to person, place, and time Neurological - alert, oriented, normal speech, no focal findings or movement disorder noted Left lower extremity is shortened and externally rotated; pulses and sensation intact; motor intact    Assessment/Plan: Left femoral neck fracture- Amber Sloan has an acute displaced left femoral neck fracture. Treatment options were discussed including operative and non-op treatments. We discussed the pros and cons of each. Amber Sloan has elected to undergo a left total hip arthroplasty. We discussed the procedure, risks and potential complications and Amber Sloan elects to proceed  Ollen Gross 05/28/2023, 8:30 AM

## 2023-05-28 NOTE — Anesthesia Procedure Notes (Signed)
Procedure Name: Intubation Date/Time: 05/28/2023 10:48 AM  Performed by: Doran Clay, CRNAPre-anesthesia Checklist: Emergency Drugs available, Suction available, Patient identified, Patient being monitored and Timeout performed Patient Re-evaluated:Patient Re-evaluated prior to induction Oxygen Delivery Method: Circle system utilized Preoxygenation: Pre-oxygenation with 100% oxygen Induction Type: IV induction Ventilation: Mask ventilation without difficulty Laryngoscope Size: Mac and 3 Grade View: Grade II Tube type: Oral Tube size: 7.0 mm Number of attempts: 1 Airway Equipment and Method: Stylet Placement Confirmation: ETT inserted through vocal cords under direct vision, positive ETCO2 and breath sounds checked- equal and bilateral Secured at: 21 cm Tube secured with: Tape Dental Injury: Teeth and Oropharynx as per pre-operative assessment

## 2023-05-28 NOTE — Progress Notes (Signed)
[facility] Progress Note Patient: Amber Sloan ZOX:[id number] DOB: 1940-11-21 DOA: 05/27/2023  DOS: the patient was seen and examined on 05/28/2023  Brief hospital course: PMH of HTN, HLD, OSA on CPAP, obesity, neuropathy.  Presented to the hospital with complaints of mechanical fall with left hip injury. Was found to have left hip femoral neck fracture. Underwent left total hip arthroplasty by Dr. Lequita Halt on 11/30.  Assessment and Plan: Left femoral neck fracture. After mechanical fall. Status post left hip total arthroplasty on 11/30. Weightbearing, pain management, DVT prophylaxis per general surgery. PT OT consulted. Will monitor.  Nonobstructive CAD pretension HTN. On metoprolol at home. Benazepril and HCTZ as well. Currently on hold. Monitor.  HLD. On statin. Currently on hold. Resume.  GERD. On a PPI at home.  Resume.  History of osteoporosis. On raloxifene. Monitor.  Mild hypokalemia. Monitor.  Leukocytosis. Likely stress reaction. Monitor.  Prediabetes. Hemoglobin A1c 5.8. For now we will monitor. Subjective: Pain well-controlled.  No nausea no vomiting no fever no chills.  No chest pain.  Physical Exam: General: in Mild distress, No Rash Cardiovascular: S1 and S2 Present, No Murmur Respiratory: Good respiratory effort, Bilateral Air entry present. No Crackles, No wheezes Abdomen: Bowel Sound present, No tenderness Extremities: Trace edema Neuro: Alert and oriented x3, no new focal deficit  Data Reviewed: I have Reviewed nursing notes, Vitals, and Lab results. Since last encounter, pertinent lab results CBC and BMP   . I have ordered test including CBC and BMP  .   Disposition: Status is: Inpatient Remains inpatient appropriate because: Monitor for improvement in pain control and postop recovery  apixaban (ELIQUIS) tablet 2.5 mg Start: 05/29/23 0800 SCDs Start: 05/28/23 1339 apixaban (ELIQUIS) tablet 2.5 mg   Family  Communication: Family at bedside Level of care: Med-Surg   Vitals:   05/28/23 1258 05/28/23 1300 05/28/23 1414 05/28/23 1626  BP:  (!) 139/58 121/60 (!) 131/57  Pulse: 81 95 69 84  Resp: 10 15 18 16   Temp:   98 F (36.7 C) 97.8 F (36.6 C)  TempSrc:   Oral   SpO2: 95% 96% 95% 98%  Weight:      Height:         Author: Lynden Oxford, MD 05/28/2023 6:44 PM  Please look on [email or web address] to find out who is on call.

## 2023-05-28 NOTE — Plan of Care (Signed)
  Problem: Safety: Goal: Ability to remain free from injury will improve Outcome: Progressing   Problem: Education: Goal: Knowledge of the prescribed therapeutic regimen will improve Outcome: Progressing   Problem: Activity: Goal: Ability to tolerate increased activity will improve Outcome: Progressing   Problem: Clinical Measurements: Goal: Postoperative complications will be avoided or minimized Outcome: Progressing   Problem: Pain Management: Goal: Pain level will decrease with appropriate interventions Outcome: Progressing

## 2023-05-28 NOTE — Progress Notes (Signed)
   05/28/23 2011  BiPAP/CPAP/SIPAP  BiPAP/CPAP/SIPAP Pt Type Adult  Reason BIPAP/CPAP not in use Non-compliant

## 2023-05-28 NOTE — Op Note (Signed)
OPERATIVE REPORT- TOTAL HIP ARTHROPLASTY   PREOPERATIVE DIAGNOSIS: Left femoral neck fracture  POSTOPERATIVE DIAGNOSIS: Left femoral neck fracture.   PROCEDURE: Left total hip arthroplasty, anterior approach.   SURGEON: Ollen Gross, MD   ASSISTANT: Clint Bolder, PA-C  ANESTHESIA:  General  ESTIMATED BLOOD LOSS:-400 mL    DRAINS: None  COMPLICATIONS: None   CONDITION: PACU - hemodynamically stable.   BRIEF CLINICAL NOTE: Amber Sloan is a 82 y.o. female who had a mechanical Fall at home yesterday sustaining a displaced left femoral neck fracture. She presents for Left Total Hip Arthroplasty  PROCEDURE IN DETAIL: After successful administration of spinal  anesthetic, the traction boots for the [facility] bed were placed on both  feet and the patient was placed onto the [facility] bed, boots placed into the leg  holders. The Left hip was then isolated from the perineum with plastic  drapes and prepped and draped in the usual sterile fashion. ASIS and  greater trochanter were marked and a oblique incision was made, starting  at about 1 cm lateral and 2 cm distal to the ASIS and coursing towards  the anterior cortex of the femur. The skin was cut with a 10 blade  through subcutaneous tissue to the level of the fascia overlying the  tensor fascia lata muscle. The fascia was then incised in line with the  incision at the junction of the anterior third and posterior 2/3rd. The  muscle was teased off the fascia and then the interval between the TFL  and the rectus was developed. The Hohmann retractor was then placed at  the top of the femoral neck over the capsule. The vessels overlying the  capsule were cauterized and the fat on top of the capsule was removed.  A Hohmann retractor was then placed anterior underneath the rectus  femoris to give exposure to the entire anterior capsule. A T-shaped  capsulotomy was performed. The edges were tagged and the femoral head  was  identified.       Osteophytes are removed off the superior acetabulum.  The femoral neck was then cut in situ with an oscillating saw. Traction  was then applied to the left lower extremity utilizing the [facility]  traction. The femoral head was then removed. Retractors were placed  around the acetabulum and then circumferential removal of the labrum was  performed. Osteophytes were also removed. Reaming starts at 45 mm to  medialize and  Increased in 2 mm increments to 47 mm. We reamed in  approximately 40 degrees of abduction, 20 degrees anteversion. A 48 mm  pinnacle acetabular shell was then impacted in anatomic position under  fluoroscopic guidance with excellent purchase. We did not need to place  any additional dome screws. A 28 mm neutral + 4 marathon liner was then  placed into the acetabular shell.       The femoral lift was then placed along the lateral aspect of the femur  just distal to the vastus ridge. The leg was  externally rotated and capsule  was stripped off the inferior aspect of the femoral neck down to the  level of the lesser trochanter, this was done with electrocautery. The femur was lifted after this was performed. The  leg was then placed in an extended and adducted position essentially delivering the femur. We also removed the capsule superiorly and the piriformis from the piriformis fossa to gain excellent exposure of the  proximal femur. Rongeur was used to remove some  cancellous bone to get  into the lateral portion of the proximal femur for placement of the  initial starter reamer. The starter broaches was placed  the starter broach  and was shown to go down the center of the canal. Broaching  with the Actis system was then performed starting at size 0  coursing  Up to size 6. A size 6 had excellent torsional and rotational  and axial stability. The trial standard offset neck was then placed  with a 28 + 1.5 trial head. The hip was then reduced. We confirmed that   the stem was in the canal both on AP and lateral x-rays. It also has excellent sizing. The hip was reduced with outstanding stability through full extension and full external rotation.. AP pelvis was taken and the leg lengths were measured and found to be equal. Hip was then dislocated again and the femoral head and neck removed. The  femoral broach was removed. Size 6  Actis stem with a standard offset  neck was then impacted into the femur following native anteversion. Has  excellent purchase in the canal. Excellent torsional and rotational and  axial stability. It is confirmed to be in the canal on AP and lateral  fluoroscopic views. The 28 + 1.5 metal head was placed and the hip  reduced with outstanding stability. Again AP pelvis was taken and it  confirmed that the leg lengths were equal. The wound was then copiously  irrigated with saline solution and the capsule reattached and repaired  with Ethibond suture. 30 ml of .25% Bupivicaine was  injected into the capsule and into the edge of the tensor fascia lata as well as subcutaneous tissue. The fascia overlying the tensor fascia lata was then closed with a running #1 V-Loc. Subcu was closed with interrupted 2-0 Vicryl and subcuticular running 4-0 Monocryl. Incision was cleaned  and dried. Steri-Strips and a bulky sterile dressing applied. The patient was awakened and transported to  recovery in stable condition.        Please note that a surgical assistant was a medical necessity for this procedure to perform it in a safe and expeditious manner. Assistant was necessary to provide appropriate retraction of vital neurovascular structures and to prevent femoral fracture and allow for anatomic placement of the prosthesis.  Ollen Gross, M.D.

## 2023-05-28 NOTE — Anesthesia Postprocedure Evaluation (Signed)
Anesthesia Post Note  Patient: Amber Sloan  Procedure(s) Performed: TOTAL HIP ARTHROPLASTY ANTERIOR APPROACH (Left: Hip)     Patient location during evaluation: PACU Anesthesia Type: General Level of consciousness: awake Pain management: pain level controlled Vital Signs Assessment: post-procedure vital signs reviewed and stable Respiratory status: spontaneous breathing, nonlabored ventilation and respiratory function stable Cardiovascular status: blood pressure returned to baseline and stable Postop Assessment: no apparent nausea or vomiting Anesthetic complications: no   No notable events documented.  Last Vitals:  Vitals:   05/28/23 1300 05/28/23 1414  BP: (!) 139/58 121/60  Pulse: 95 69  Resp: 15 18  Temp:  36.7 C  SpO2: 96% 95%    Last Pain:  Vitals:   05/28/23 1414  TempSrc: Oral  PainSc:                  [name]

## 2023-05-28 NOTE — Plan of Care (Signed)
Problem: Education: Goal: Ability to describe self-care measures that may prevent or decrease complications (Diabetes Survival Skills Education) will improve Outcome: Progressing   Problem: Coping: Goal: Ability to adjust to condition or change in health will improve Outcome: Progressing   Problem: Clinical Measurements: Goal: Ability to maintain clinical measurements within normal limits will improve Outcome: Progressing  Problem: Pain Management: Goal: General experience of comfort will improve Outcome: Progressing   Haydee Salter, RN 05/28/23 5:47 PM

## 2023-05-28 NOTE — Discharge Instructions (Signed)
Information on my medicine - ELIQUIS (apixaban)  Why was Eliquis prescribed for you? Eliquis was prescribed for you to reduce the risk of blood clots forming after orthopedic surgery.    What do You need to know about Eliquis? Take your Eliquis TWICE DAILY - one tablet in the morning and one tablet in the evening with or without food.  It would be best to take the dose about the same time each day.  If you have difficulty swallowing the tablet whole please discuss with your pharmacist how to take the medication safely.  Take Eliquis exactly as prescribed by your doctor and DO NOT stop taking Eliquis without talking to the doctor who prescribed the medication.  Stopping without other medication to take the place of Eliquis may increase your risk of developing a clot.  After discharge, you should have regular check-up appointments with your healthcare provider that is prescribing your Eliquis.  What do you do if you miss a dose? If a dose of ELIQUIS is not taken at the scheduled time, take it as soon as possible on the same day and twice-daily administration should be resumed.  The dose should not be doubled to make up for a missed dose.  Do not take more than one tablet of ELIQUIS at the same time.  Important Safety Information A possible side effect of Eliquis is bleeding. You should call your healthcare provider right away if you experience any of the following: Bleeding from an injury or your nose that does not stop. Unusual colored urine (red or dark brown) or unusual colored stools (red or black). Unusual bruising for unknown reasons. A serious fall or if you hit your head (even if there is no bleeding).  Some medicines may interact with Eliquis and might increase your risk of bleeding or clotting while on Eliquis. To help avoid this, consult your healthcare provider or pharmacist prior to using any new prescription or non-prescription medications, including herbals,  vitamins, non-steroidal anti-inflammatory drugs (NSAIDs) and supplements.  This website has more information on Eliquis (apixaban): http://[email or web address].com/eliquis/home  

## 2023-05-28 NOTE — Transfer of Care (Signed)
Immediate Anesthesia Transfer of Care Note  Patient: Amber Sloan  Procedure(s) Performed: TOTAL HIP ARTHROPLASTY ANTERIOR APPROACH (Left: Hip)  Patient Location: PACU  Anesthesia Type:General  Level of Consciousness: sedated  Airway & Oxygen Therapy: Patient Spontanous Breathing and Patient connected to face mask oxygen  Post-op Assessment: Report given to RN and Post -op Vital signs reviewed and stable  Post vital signs: Reviewed and stable  Last Vitals:  Vitals Value Taken Time  BP    Temp    Pulse 85 05/28/23 1223  Resp 31 05/28/23 1223  SpO2 98 % 05/28/23 1223  Vitals shown include unfiled device data.  Last Pain:  Vitals:   05/28/23 0858  TempSrc:   PainSc: 7       Patients Stated Pain Goal: 3 (05/27/23 2027)  Complications: No notable events documented.

## 2023-05-28 NOTE — Anesthesia Preprocedure Evaluation (Addendum)
Anesthesia Evaluation  Patient identified by MRN, date of birth, ID band Patient awake    Reviewed: Allergy & Precautions, NPO status , Patient's Chart, lab work & pertinent test results  History of Anesthesia Complications (+) PONV and history of anesthetic complications  Airway Mallampati: II  TM Distance: >3 FB Neck ROM: Full    Dental no notable dental hx.    Pulmonary sleep apnea and Continuous Positive Airway Pressure Ventilation    Pulmonary exam normal        Cardiovascular hypertension, Pt. on medications and Pt. on home beta blockers Normal cardiovascular exam     Neuro/Psych  Neuromuscular disease    GI/Hepatic Neg liver ROS,GERD  Medicated and Controlled,,  Endo/Other  negative endocrine ROS    Renal/GU negative Renal ROS     Musculoskeletal  (+) Arthritis ,    Abdominal   Peds  Hematology negative hematology ROS (+)   Anesthesia Other Findings left femoral neck fracture  Reproductive/Obstetrics                             Anesthesia Physical Anesthesia Plan  ASA: 3  Anesthesia Plan: General   Post-op Pain Management:    Induction: Intravenous  PONV Risk Score and Plan: 4 or greater and Ondansetron, Dexamethasone, Propofol infusion and Treatment may vary due to age or medical condition  Airway Management Planned: Oral ETT  Additional Equipment:   Intra-op Plan:   Post-operative Plan: Extubation in OR  Informed Consent: I have reviewed the patients History and Physical, chart, labs and discussed the procedure including the risks, benefits and alternatives for the proposed anesthesia with the patient or authorized representative who has indicated his/her understanding and acceptance.     Dental advisory given  Plan Discussed with: CRNA  Anesthesia Plan Comments:        Anesthesia Quick Evaluation

## 2023-05-29 DIAGNOSIS — S72142A Displaced intertrochanteric fracture of left femur, initial encounter for closed fracture: Secondary | ICD-10-CM | POA: Diagnosis not present

## 2023-05-29 LAB — BASIC METABOLIC PANEL
Anion gap: 6 (ref 5–15)
BUN: 9 mg/dL (ref 8–23)
CO2: 26 mmol/L (ref 22–32)
Calcium: 8.1 mg/dL — ABNORMAL LOW (ref 8.9–10.3)
Chloride: 98 mmol/L (ref 98–111)
Creatinine, Ser: 0.69 mg/dL (ref 0.44–1.00)
GFR, Estimated: >60 mL/min (ref >60)
Glucose, Bld: 124 mg/dL — ABNORMAL HIGH (ref 70–99)
Potassium: 4.4 mmol/L (ref 3.5–5.1)
Sodium: 130 mmol/L — ABNORMAL LOW (ref 135–145)

## 2023-05-29 LAB — CBC
HCT: 33.1 % — ABNORMAL LOW (ref 36.0–46.0)
Hemoglobin: 11.1 g/dL — ABNORMAL LOW (ref 12.0–15.0)
MCH: 30.1 pg (ref 26.0–34.0)
MCHC: 33.5 g/dL (ref 30.0–36.0)
MCV: 89.7 fL (ref 80.0–100.0)
Platelets: 189 10*3/uL (ref 150–400)
RBC: 3.69 MIL/uL — ABNORMAL LOW (ref 3.87–5.11)
RDW: 12.6 % (ref 11.5–15.5)
WBC: 12.6 10*3/uL — ABNORMAL HIGH (ref 4.0–10.5)
nRBC: 0 % (ref 0.0–0.2)

## 2023-05-29 LAB — GLUCOSE, CAPILLARY
Glucose-Capillary: 85 mg/dL (ref 70–99)
Glucose-Capillary: 161 mg/dL — ABNORMAL HIGH (ref 70–99)
Glucose-Capillary: 158 mg/dL — ABNORMAL HIGH (ref 70–99)
Glucose-Capillary: 129 mg/dL — ABNORMAL HIGH (ref 70–99)

## 2023-05-29 LAB — MAGNESIUM: Magnesium: 2 mg/dL (ref 1.7–2.4)

## 2023-05-29 MED ORDER — SENNOSIDES-DOCUSATE SODIUM 8.6-50 MG PO TABS
1.0000 | ORAL_TABLET | Freq: Two times a day (BID) | ORAL | Status: DC
  Administered 2023-05-29 – 2023-05-30 (×3): 1 via ORAL
  Filled 2023-05-29 (×3): qty 1

## 2023-05-29 MED ORDER — BENAZEPRIL HCL 5 MG PO TABS
5.0000 mg | ORAL_TABLET | Freq: Every day | ORAL | Status: DC
  Administered 2023-05-30: 5 mg via ORAL
  Filled 2023-05-29 (×2): qty 1

## 2023-05-29 MED ORDER — ADULT MULTIVITAMIN W/MINERALS CH
1.0000 | ORAL_TABLET | Freq: Every day | ORAL | Status: DC
  Administered 2023-05-29 – 2023-05-30 (×2): 1 via ORAL
  Filled 2023-05-29 (×2): qty 1

## 2023-05-29 MED ORDER — ENSURE ENLIVE PO LIQD
237.0000 mL | Freq: Two times a day (BID) | ORAL | Status: DC
  Administered 2023-05-29 – 2023-05-30 (×2): 237 mL via ORAL

## 2023-05-29 NOTE — Progress Notes (Signed)
[facility] Progress Note Patient: Amber Sloan QIO:[id number] DOB: Nov 18, 1940 DOA: 05/27/2023  DOS: the patient was seen and examined on 05/29/2023  Brief hospital course: PMH of HTN, HLD, OSA on CPAP, obesity, neuropathy.  Presented to the hospital with complaints of mechanical fall with left hip injury. Was found to have left hip femoral neck fracture. Underwent left total hip arthroplasty by Dr. Lequita Halt on 11/30.   Assessment and Plan: Left femoral neck fracture. After mechanical fall. Status post left hip total arthroplasty on 11/30. Weightbearing, pain management, DVT prophylaxis per general surgery. PT OT consulted. Will monitor.  Postop acute blood loss anemia. Baseline hemoglobin around 13.  Dropped down to 11. Monitor.  Hyponatremia. For now monitor.  Likely from poor p.o. intake postoperatively.  Currently asymptomatic.   Nonobstructive CAD pretension HTN. On metoprolol at home. Benazepril and HCTZ as well. Currently on hold. Monitor.   HLD. On statin. Currently on hold. Resume.   GERD. On a PPI at home.  Resume.   History of osteoporosis. On raloxifene. Monitor.   Mild hypokalemia. Monitor.   Leukocytosis. Likely stress reaction. Monitor.   Prediabetes. Hemoglobin A1c 5.8. For now we will monitor.   Subjective: Pain well-controlled.  No nausea no vomiting no fever no chills.  Physical Exam: General: in Mild distress, No Rash Cardiovascular: S1 and S2 Present, No Murmur Respiratory: Good respiratory effort, Bilateral Air entry present. No Crackles, No wheezes Abdomen: Bowel Sound present, No tenderness Extremities: No edema Neuro: Alert and oriented x3, no new focal deficit  Data Reviewed: I have Reviewed nursing notes, Vitals, and Lab results. Since last encounter, pertinent lab results CBC and BMP   . I have ordered test including CBC and BMP  .   Disposition: Status is: Inpatient Remains inpatient appropriate because:  Monitor for pain control  apixaban (ELIQUIS) tablet 2.5 mg Start: 05/29/23 0800 SCDs Start: 05/28/23 1339 apixaban (ELIQUIS) tablet 2.5 mg   Family Communication: Husband at bedside Level of care: Med-Surg   Vitals:   05/28/23 2101 05/29/23 0131 05/29/23 0548 05/29/23 1319  BP: 130/60 (!) 114/49 119/65 137/66  Pulse: 73 78 73 85  Resp: 17 17 15 18   Temp: 97.9 F (36.6 C) 97.7 F (36.5 C) (!) 97.4 F (36.3 C) 98.9 F (37.2 C)  TempSrc:      SpO2: 97% 100% 98% 93%  Weight:      Height:         Author: Lynden Oxford, MD 05/29/2023 6:10 PM  Please look on [email or web address] to find out who is on call.

## 2023-05-29 NOTE — Progress Notes (Signed)
Initial Nutrition Assessment  DOCUMENTATION CODES:   Not applicable  INTERVENTION:  Liberalize diet Multivitamin/minerals Ensure Plus High Protein po BID, each supplement provides 350 kcal and 20 grams of protein.    NUTRITION DIAGNOSIS:   Increased nutrient needs related to hip fracture as evidenced by estimated needs.     GOAL:   Patient will meet greater than or equal to 90% of their needs    MONITOR:   PO intake, Supplement acceptance, Labs  REASON FOR ASSESSMENT:   Consult Assessment of nutrition requirement/status, Hip fracture protocol  ASSESSMENT:  29 y.oy F. Present from home to [facility] after falling off stool and landing on floor causing Left side hip pain. Resulting in admission with hip fracture. PMH; HTN, HLD SOB, Diverticulitis, arthritis,colitis gallbladder disease, GERD, CTA. 11/30 underwent  left total hip arthoplasty. Noted to have good appetite with on changes. There is no benefit to excessive dietary restrictions related advanced age, increased nutrient needs  . Patient would benefit better from liberalized diet to help meet increased needs and promote better oral intake.   Admit weight: 72 kg Current weight: 72 kg  Weight history: 05/27/23 72 kg  01/26/23 69.8 kg  06/21/22 69 kg      Average Meal Intake: NPO  Nutritionally Relevant Medications: Scheduled Meds:  apixaban  2.5 mg Oral Q12H   magnesium oxide  400 mg Oral Daily   pantoprazole  80 mg Oral Daily   senna-docusate  1 tablet Oral BID   zinc sulfate (50mg  elemental zinc)  220 mg Oral q AM    PRN Meds:.acetaminophen, acetaminophen, bisacodyl, diphenhydrAMINE, HYDROcodone-acetaminophen, HYDROcodone-acetaminophen, menthol-cetylpyridinium **OR** phenol, methocarbamol, morphine injection, ondansetron (ZOFRAN) IV, polyethylene glycol, sodium phosphate, zolpidem  Labs Reviewed:  CBG ranges from 124-146 mg/dL over the last 24 hours HgbA1c 5.8    NUTRITION - FOCUSED PHYSICAL  EXAM:  Deferred   Diet Order:   Diet Order             Diet heart healthy/carb modified Room service appropriate? Yes; Fluid consistency: Thin  Diet effective now                   EDUCATION NEEDS:   Education needs have been addressed  Skin:  Skin Assessment: Skin Integrity Issues: Skin Integrity Issues:: Incisions Incisions: Left hip  Last BM:  PTA  Height:   Ht Readings from Last 1 Encounters:  05/27/23 5' 3.5" (1.613 m)    Weight:   Wt Readings from Last 1 Encounters:  05/27/23 72 kg    Ideal Body Weight:     BMI:  Body mass index is 27.68 kg/m.  Estimated Nutritional Needs:   Kcal:  2150-2500 kcal/d  Protein:  95-110 g/d  Fluid:  3ml/kcal    Jamelle Haring RDN, LDN Clinical Dietitian  RDN pager # available on Amion

## 2023-05-29 NOTE — Progress Notes (Signed)
   05/29/23 2237  BiPAP/CPAP/SIPAP  BiPAP/CPAP/SIPAP Pt Type Adult  Reason BIPAP/CPAP not in use Non-compliant

## 2023-05-29 NOTE — Progress Notes (Signed)
Subjective: 1 Day Post-Op Procedure(s) (LRB): TOTAL HIP ARTHROPLASTY ANTERIOR APPROACH (Left) Patient reports pain as 4 on 0-10 scale.   Denies CP or SOB.  Voiding without difficulty. Positive flatus. Objective: Vital signs in last 24 hours: Temp:  [97.4 F (36.3 C)-98.9 F (37.2 C)] 97.4 F (36.3 C) (12/01 0548) Pulse Rate:  [69-95] 73 (12/01 0548) Resp:  [9-18] 15 (12/01 0548) BP: (114-141)/(49-65) 119/65 (12/01 0548) SpO2:  [95 %-100 %] 98 % (12/01 0548)  Intake/Output from previous day: 11/30 0701 - 12/01 0700 In: 2692.5 [P.O.:360; I.V.:2332.5] Out: 2650 [Urine:2250; Blood:400] Intake/Output this shift: No intake/output data recorded.  Recent Labs    05/27/23 1625 05/29/23 0314  HGB 13.2 11.1*   Recent Labs    05/27/23 1625 05/29/23 0314  WBC 14.8* 12.6*  RBC 4.40 3.69*  HCT 39.1 33.1*  PLT 189 189   Recent Labs    05/27/23 1625 05/29/23 0314  NA 134* 130*  K 3.4* 4.4  CL 96* 98  CO2 26 26  BUN 9 9  CREATININE 0.76 0.69  GLUCOSE 146* 124*  CALCIUM 9.0 8.1*   No results for input(s): "LABPT", "INR" in the last 72 hours.  Neurologically intact Neurovascular intact Sensation intact distally Intact pulses distally Dorsiflexion/Plantar flexion intact Incision: dressing C/D/I Compartment soft No DVT  Assessment/Plan:  1 Day Post-Op Procedure(s) (LRB): TOTAL HIP ARTHROPLASTY ANTERIOR APPROACH (Left) Advance diet Up with therapy D/C planning  Principal Problem:   Displaced intertrochanteric fracture of left femur, initial encounter for closed fracture [facility]) Active Problems:   Essential hypertension   Hyperlipidemia   Laryngopharyngeal reflux (LPR)   Closed comminuted intertrochanteric fracture of left femur (HCC)      Amber Sloan 05/29/2023, @[facility] 

## 2023-05-29 NOTE — Progress Notes (Signed)
Physical Therapy Treatment Patient Details Name: Amber Sloan MRN: [id number] DOB: [birth year] Today's Date: 05/29/2023   History of Present Illness 82 yo female admitted with L hip/femur IT fracture. S/P L THA-DA 05/28/23. Hx of chest pain, obesity, neuropathy, sq cell Ca face, oteopenia, OSA, lumbar fusion, chronic pain    PT Comments  O2 87% on RA with ambulation. Pain remains controlled per pt report. Will continue to follow and progress activity. Recommend RW and HHPT.    If plan is discharge home, recommend the following: A little help with walking and/or transfers;A little help with bathing/dressing/bathroom;Assistance with cooking/housework;Assist for transportation;Help with stairs or ramp for entrance   Can travel by private vehicle        Equipment Recommendations  Rolling walker (2 wheels)    Recommendations for Other Services       Precautions / Restrictions Precautions Precautions: Fall Restrictions Weight Bearing Restrictions: No LLE Weight Bearing: Weight bearing as tolerated     Mobility  Bed Mobility Overal bed mobility: Needs Assistance Bed Mobility: Supine to Sit     Supine to sit: Supervision, HOB elevated     General bed mobility comments: oob in recliner    Transfers Overall transfer level: Needs assistance Equipment used: Rolling walker (2 wheels) Transfers: Sit to/from Stand Sit to Stand: Supervision           General transfer comment: Cues for safety, hand placement.    Ambulation/Gait Ambulation/Gait assistance: Supervision Gait Distance (Feet): 120 Feet Assistive device: Rolling walker (2 wheels) Gait Pattern/deviations: Step-through pattern, Decreased stride length, Antalgic           Stairs             Wheelchair Mobility     Tilt Bed    Modified Rankin (Stroke Patients Only)       Balance Overall balance assessment: Needs assistance         Standing balance support: Reliant on assistive  device for balance, Bilateral upper extremity supported, During functional activity Standing balance-Leahy Scale: Fair                              Cognition Arousal: Alert Behavior During Therapy: WFL for tasks assessed/performed Overall Cognitive Status: Within Functional Limits for tasks assessed                                          Exercises Total Joint Exercises Ankle Circles/Pumps: AROM, Both, 10 reps Quad Sets: AROM, Both, 10 reps Heel Slides: AROM, Left, 10 reps, AAROM Hip ABduction/ADduction: AROM, Left, 10 reps    General Comments        Pertinent Vitals/Pain Pain Assessment Pain Assessment: Faces Faces Pain Scale: Hurts a little bit Pain Location: L hip/thigh Pain Descriptors / Indicators: Discomfort, Sore Pain Intervention(s): Limited activity within patient's tolerance, Monitored during session, Repositioned, Ice applied    Home Living Family/patient expects to be discharged to:: Private residence Living Arrangements: Spouse/significant other   Type of Home: House Home Access: Stairs to enter Entrance Stairs-Rails:  ("something to hold on to") Entrance Stairs-Number of Steps: 2   Home Layout: One level Home Equipment: None      Prior Function            PT Goals (current goals can now be found in the care plan section) Acute Rehab  PT Goals Patient Stated Goal: regain PLOF/independence PT Goal Formulation: With patient Time For Goal Achievement: 06/12/23 Potential to Achieve Goals: Good Progress towards PT goals: Progressing toward goals    Frequency    Min 1X/week      PT Plan      Co-evaluation              AM-PAC PT "6 Clicks" Mobility   Outcome Measure  Help needed turning from your back to your side while in a flat bed without using bedrails?: A Little Help needed moving from lying on your back to sitting on the side of a flat bed without using bedrails?: A Little Help needed moving to and  from a bed to a chair (including a wheelchair)?: A Little Help needed standing up from a chair using your arms (e.g., wheelchair or bedside chair)?: A Little Help needed to walk in hospital room?: A Little Help needed climbing 3-5 steps with a railing? : A Little 6 Click Score: 18    End of Session Equipment Utilized During Treatment: Gait belt Activity Tolerance: Patient tolerated treatment well Patient left: in chair;with call bell/phone within reach;with family/visitor present   PT Visit Diagnosis: Pain;Other abnormalities of gait and mobility (R26.89) Pain - Right/Left: Left Pain - part of body: Hip     Time: 1441-1449 PT Time Calculation (min) (ACUTE ONLY): 8 min  Charges:    $Gait Training: 8-22 mins PT General Charges $$ ACUTE PT VISIT: 1 Visit                         Faye Ramsay, PT Acute Rehabilitation  Office: [phone]

## 2023-05-29 NOTE — Evaluation (Signed)
Physical Therapy Evaluation Patient Details Name: Amber Sloan MRN: [id number] DOB: 1941/02/16 Today's Date: 05/29/2023  History of Present Illness  82 yo female admitted with L hip/femur IT fracture. S/P L THA-DA 05/28/23. Hx of chest pain, obesity, neuropathy, sq cell Ca face, oteopenia, OSA, lumbar fusion, chronic pain  Clinical Impression  On eval, pt was CGA for mobility. She walked ~75 feet with a RW. Pain controlled per pt report. Will continue to follow and progress activity as tolerated. Will recommend RW for ambulation and HHPT f/u        If plan is discharge home, recommend the following: A little help with walking and/or transfers;A little help with bathing/dressing/bathroom;Assistance with cooking/housework;Assist for transportation;Help with stairs or ramp for entrance   Can travel by private vehicle        Equipment Recommendations Rolling walker (2 wheels)  Recommendations for Other Services       Functional Status Assessment Patient has had a recent decline in their functional status and demonstrates the ability to make significant improvements in function in a reasonable and predictable amount of time.     Precautions / Restrictions Precautions Precautions: Fall Restrictions Weight Bearing Restrictions: No LLE Weight Bearing: Weight bearing as tolerated      Mobility  Bed Mobility Overal bed mobility: Needs Assistance Bed Mobility: Supine to Sit     Supine to sit: Supervision, HOB elevated          Transfers Overall transfer level: Needs assistance Equipment used: Rolling walker (2 wheels) Transfers: Sit to/from Stand Sit to Stand: Contact guard assist           General transfer comment: Cues for safety, hand placement.    Ambulation/Gait Ambulation/Gait assistance: Contact guard assist Gait Distance (Feet): 75 Feet Assistive device: Rolling walker (2 wheels) Gait Pattern/deviations: Antalgic, Step-to pattern           Stairs            Wheelchair Mobility     Tilt Bed    Modified Rankin (Stroke Patients Only)       Balance Overall balance assessment: Needs assistance         Standing balance support: Reliant on assistive device for balance, Bilateral upper extremity supported, During functional activity Standing balance-Leahy Scale: Poor                               Pertinent Vitals/Pain Pain Assessment Pain Assessment: Faces Faces Pain Scale: Hurts little more Pain Location: L hip/thigh Pain Descriptors / Indicators: Aching, Discomfort Pain Intervention(s): Limited activity within patient's tolerance, Monitored during session, Ice applied, Repositioned    Home Living Family/patient expects to be discharged to:: Private residence Living Arrangements: Spouse/significant other   Type of Home: House Home Access: Stairs to enter Entrance Stairs-Rails:  ("something to hold on to") Secretary/administrator of Steps: 2   Home Layout: One level Home Equipment: None      Prior Function Prior Level of Function : Independent/Modified Independent                     Extremity/Trunk Assessment   Upper Extremity Assessment Upper Extremity Assessment: Overall WFL for tasks assessed    Lower Extremity Assessment Lower Extremity Assessment: Generalized weakness    Cervical / Trunk Assessment Cervical / Trunk Assessment: Normal  Communication   Communication Communication: No apparent difficulties  Cognition Arousal: Alert Behavior During Therapy: [facility] for tasks assessed/performed  Overall Cognitive Status: Within Functional Limits for tasks assessed                                          General Comments      Exercises Total Joint Exercises Ankle Circles/Pumps: AROM, Both, 10 reps Quad Sets: AROM, Both, 10 reps Heel Slides: AROM, Left, 10 reps, AAROM Hip ABduction/ADduction: AROM, Left, 10 reps   Assessment/Plan    PT  Assessment Patient needs continued PT services  PT Problem List Decreased strength;Decreased range of motion;Decreased activity tolerance;Decreased balance;Decreased mobility;Decreased knowledge of use of DME;Pain       PT Treatment Interventions DME instruction;Gait training;Functional mobility training;Therapeutic activities;Stair training;Patient/family education;Therapeutic exercise;Balance training    PT Goals (Current goals can be found in the Care Plan section)  Acute Rehab PT Goals Patient Stated Goal: regain PLOF/independence PT Goal Formulation: With patient Time For Goal Achievement: 06/12/23 Potential to Achieve Goals: Good    Frequency Min 1X/week     Co-evaluation               AM-PAC PT "6 Clicks" Mobility  Outcome Measure Help needed turning from your back to your side while in a flat bed without using bedrails?: A Little Help needed moving from lying on your back to sitting on the side of a flat bed without using bedrails?: A Little Help needed moving to and from a bed to a chair (including a wheelchair)?: A Little Help needed standing up from a chair using your arms (e.g., wheelchair or bedside chair)?: A Little Help needed to walk in hospital room?: A Little Help needed climbing 3-5 steps with a railing? : A Little 6 Click Score: 18    End of Session Equipment Utilized During Treatment: Gait belt Activity Tolerance: Patient tolerated treatment well Patient left: in chair;with family/visitor present;with call bell/phone within reach   PT Visit Diagnosis: Pain;Other abnormalities of gait and mobility (R26.89) Pain - Right/Left: Left Pain - part of body: Hip    Time: 1610-9604 PT Time Calculation (min) (ACUTE ONLY): 16 min   Charges:   PT Evaluation $PT Eval Low Complexity: 1 Low   PT General Charges $$ ACUTE PT VISIT: 1 Visit            Faye Ramsay, PT Acute Rehabilitation  Office: [phone]

## 2023-05-29 NOTE — Hospital Course (Signed)
Brief hospital course: PMH of HTN, HLD, OSA on CPAP, obesity, neuropathy.  Presented to the hospital with complaints of mechanical fall with left hip injury. Was found to have left hip femoral neck fracture. Underwent left total hip arthroplasty by Dr. Lequita Halt on 11/30.   Assessment and Plan: Left femoral neck fracture. After mechanical fall. Status post left hip total arthroplasty on 11/30. Weightbearing, pain management, DVT prophylaxis per general surgery. PT OT consulted. Will monitor.  Postop acute blood loss anemia. Baseline hemoglobin around 13.  Dropped down to 11. Monitor.  Hyponatremia. For now monitor.  Likely from poor p.o. intake postoperatively.  Currently asymptomatic.   Nonobstructive CAD pretension HTN. On metoprolol at home. Benazepril and HCTZ as well. Currently on hold. Monitor.   HLD. On statin. Currently on hold. Resume.   GERD. On a PPI at home.  Resume.   History of osteoporosis. On raloxifene. Monitor.   Mild hypokalemia. Monitor.   Leukocytosis. Likely stress reaction. Monitor.   Prediabetes. Hemoglobin A1c 5.8. For now we will monitor.

## 2023-05-30 ENCOUNTER — Encounter (HOSPITAL_COMMUNITY): Payer: Self-pay | Admitting: Orthopedic Surgery

## 2023-05-30 LAB — MAGNESIUM: Magnesium: 2 mg/dL (ref 1.7–2.4)

## 2023-05-30 LAB — BASIC METABOLIC PANEL
Anion gap: 6 (ref 5–15)
BUN: 13 mg/dL (ref 8–23)
CO2: 27 mmol/L (ref 22–32)
Calcium: 8.1 mg/dL — ABNORMAL LOW (ref 8.9–10.3)
Chloride: 99 mmol/L (ref 98–111)
Creatinine, Ser: 0.59 mg/dL (ref 0.44–1.00)
GFR, Estimated: >60 mL/min (ref >60)
Glucose, Bld: 117 mg/dL — ABNORMAL HIGH (ref 70–99)
Potassium: 3.8 mmol/L (ref 3.5–5.1)
Sodium: 132 mmol/L — ABNORMAL LOW (ref 135–145)

## 2023-05-30 LAB — CBC
HCT: 29.7 % — ABNORMAL LOW (ref 36.0–46.0)
Hemoglobin: 10.1 g/dL — ABNORMAL LOW (ref 12.0–15.0)
MCH: 30.4 pg (ref 26.0–34.0)
MCHC: 34 g/dL (ref 30.0–36.0)
MCV: 89.5 fL (ref 80.0–100.0)
Platelets: 178 10*3/uL (ref 150–400)
RBC: 3.32 MIL/uL — ABNORMAL LOW (ref 3.87–5.11)
RDW: 12.8 % (ref 11.5–15.5)
WBC: 12.8 10*3/uL — ABNORMAL HIGH (ref 4.0–10.5)
nRBC: 0 % (ref 0.0–0.2)

## 2023-05-30 LAB — GLUCOSE, CAPILLARY: Glucose-Capillary: 93 mg/dL (ref 70–99)

## 2023-05-30 MED ORDER — HYDROCODONE-ACETAMINOPHEN 5-325 MG PO TABS: 1.0000 | ORAL_TABLET | Freq: Four times a day (QID) | ORAL | 0 refills | Status: DC | PRN

## 2023-05-30 MED ORDER — ACETAMINOPHEN 325 MG PO TABS: 325.0000 mg | ORAL_TABLET | Freq: Four times a day (QID) | ORAL | Status: DC | PRN

## 2023-05-30 MED ORDER — APIXABAN 2.5 MG PO TABS: 2.5000 mg | ORAL_TABLET | Freq: Two times a day (BID) | ORAL | 0 refills | Status: AC

## 2023-05-30 MED ORDER — DOCUSATE SODIUM 100 MG PO CAPS: 100.0000 mg | ORAL_CAPSULE | Freq: Two times a day (BID) | ORAL | 0 refills | Status: DC

## 2023-05-30 MED ORDER — METHOCARBAMOL 500 MG PO TABS: 500.0000 mg | ORAL_TABLET | Freq: Three times a day (TID) | ORAL | 0 refills | Status: DC | PRN

## 2023-05-30 MED ORDER — SENNA 8.6 MG PO TABS
2.0000 | ORAL_TABLET | Freq: Every day | ORAL | 0 refills | Status: DC | PRN
Start: 2023-05-30 — End: 2024-04-02

## 2023-05-30 MED ORDER — POLYETHYLENE GLYCOL 3350 17 G PO PACK: 17.0000 g | PACK | Freq: Every day | ORAL | 0 refills | Status: DC

## 2023-05-30 NOTE — Plan of Care (Signed)
  Problem: Safety: Goal: Ability to remain free from injury will improve Outcome: Progressing   Problem: Activity: Goal: Ability to tolerate increased activity will improve Outcome: Progressing   Problem: Clinical Measurements: Goal: Postoperative complications will be avoided or minimized Outcome: Progressing   Problem: Pain Management: Goal: Pain level will decrease with appropriate interventions Outcome: Progressing

## 2023-05-30 NOTE — Progress Notes (Addendum)
   Subjective: 2 Days Post-Op Procedure(s) (LRB): TOTAL HIP ARTHROPLASTY ANTERIOR APPROACH (Left) Patient reports pain as mild.   Plan is to go Home after hospital stay.  Objective: Vital signs in last 24 hours: Temp:  [97.7 F (36.5 C)-98.9 F (37.2 C)] 97.7 F (36.5 C) (12/02 0530) Pulse Rate:  [68-85] 70 (12/02 0530) Resp:  [15-18] 18 (12/02 0530) BP: (121-137)/(48-66) 121/48 (12/02 0530) SpO2:  [93 %-100 %] 100 % (12/02 0530)  Intake/Output from previous day:  Intake/Output Summary (Last 24 hours) at 05/30/2023 0708 Last data filed at 05/30/2023 0626 Gross per 24 hour  Intake 840 ml  Output 200 ml  Net 640 ml    Intake/Output this shift: No intake/output data recorded.  Labs: Recent Labs    05/27/23 1625 05/29/23 0314 05/30/23 0320  HGB 13.2 11.1* 10.1*   Recent Labs    05/29/23 0314 05/30/23 0320  WBC 12.6* 12.8*  RBC 3.69* 3.32*  HCT 33.1* 29.7*  PLT 189 178   Recent Labs    05/29/23 0314 05/30/23 0320  NA 130* 132*  K 4.4 3.8  CL 98 99  CO2 26 27  BUN 9 13  CREATININE 0.69 0.59  GLUCOSE 124* 117*  CALCIUM 8.1* 8.1*   No results for input(s): "LABPT", "INR" in the last 72 hours.  EXAM General - Patient is Alert, Appropriate, and Oriented Extremity - Neurologically intact Neurovascular intact Incision: dressing C/D/I No cellulitis present Dressing/Incision - clean, dry, no drainage Motor Function - intact, moving foot and toes well on exam.   Past Medical History:  Diagnosis Date   Anginal pain (HCC)    had cardiac work up with Dr Allyson Sabal, had a normal Stress test.   Arthritis    Breast mass, right    Hx of (Dr. Parke Simmers)   Chest pain    Constipation    Diverticulitis    Dysrhythmia    Environmental allergies    Family history of heart disease    Hyperlipidemia    pure, continue on Simvastatin   Hypertension    benign, uncontrolled, continue on Benazepril-Hydrochlorothiazide   Menopause    Obesity, unspecified    Osteopenia     on Evista since 2007   Peripheral neuropathy    left   PONV (postoperative nausea and vomiting)    Shortness of breath dyspnea    when walking upastairs    Sleep apnea    on cpap   Squamous cell carcinoma of face     Assessment/Plan: 2 Days Post-Op Procedure(s) (LRB): TOTAL HIP ARTHROPLASTY ANTERIOR APPROACH (Left) Principal Problem:   Displaced intertrochanteric fracture of left femur, initial encounter for closed fracture [facility]) Active Problems:   Essential hypertension   Hyperlipidemia   Laryngopharyngeal reflux (LPR)   Closed comminuted intertrochanteric fracture of left femur (HCC)   Up with therapy Discharge home with home health OK to go home today after PT  DVT Prophylaxis -  Eliquis Weight Bearing As Tolerated left Leg  Ollen Gross 05/30/2023, 7:08 AM

## 2023-05-30 NOTE — TOC Transition Note (Signed)
Transition of Care [facility]) - CM/SW Discharge Note   Patient Details  Name: Amber Sloan MRN: [id number] Date of Birth: 12-16-1940  Transition of Care [facility]) CM/SW Contact:  Amada Jupiter, LCSW Phone Number: 05/30/2023, 10:03 AM   Clinical Narrative:     Met with pt to review HH and DME recommendations per PT eval.  Pt confirms need for RW and is agreeable with plan for HHPT.  She has no agency preferences.  RW ordered via [employer] for delivery to room.  HHPT arranged with [facility] and agency infon on AVS.  No further TOC needs.  Final next level of care: Home w Home Health Services Barriers to Discharge: No Barriers Identified   Patient Goals and CMS Choice      Discharge Placement                         Discharge Plan and Services Additional resources added to the After Visit Summary for                  DME Arranged: Walker rolling DME Agency: [employer] Date DME Agency Contacted: 05/30/23 Time DME Agency Contacted: 1002 Representative spoke with at DME Agency: Ian Malkin HH Arranged: PT HH Agency: [facility] Date [facility] Agency Contacted: 05/30/23 Time HH Agency Contacted: 1002 Representative spoke with at [facility] Agency: Amy  Social Determinants of Health (SDOH) Interventions SDOH Screenings   Food Insecurity: No Food Insecurity (05/27/2023)  Housing: Low Risk  (05/27/2023)  Transportation Needs: No Transportation Needs (05/27/2023)  Utilities: Not At Risk (05/27/2023)  Tobacco Use: Low Risk  (05/27/2023)     Readmission Risk Interventions     No data to display

## 2023-05-30 NOTE — Progress Notes (Signed)
Physical Therapy Treatment Patient Details Name: Amber Sloan MRN: [id number] DOB: 1940-07-24 Today's Date: 05/30/2023   History of Present Illness 82 yo female admitted with L hip/femur IT fracture. S/P L THA-DA 05/28/23. Hx of chest pain, obesity, neuropathy, sq cell Ca face, oteopenia, OSA, lumbar fusion, chronic pain    PT Comments  Pt continues to participate well. Moderate pain with activity on today. O2 90% on RA after ambulation. Reviewed/practiced exercises, gait training, and stair training. All PT education completed.     If plan is discharge home, recommend the following: A little help with walking and/or transfers;A little help with bathing/dressing/bathroom;Assistance with cooking/housework;Assist for transportation;Help with stairs or ramp for entrance   Can travel by private vehicle        Equipment Recommendations  Rolling walker (2 wheels)    Recommendations for Other Services       Precautions / Restrictions Precautions Precautions: Fall Restrictions Weight Bearing Restrictions: No LLE Weight Bearing: Weight bearing as tolerated     Mobility  Bed Mobility               General bed mobility comments: oob in recliner    Transfers Overall transfer level: Needs assistance Equipment used: Rolling walker (2 wheels) Transfers: Sit to/from Stand Sit to Stand: Supervision           General transfer comment: Cues for safety, hand placement.    Ambulation/Gait Ambulation/Gait assistance: Supervision Gait Distance (Feet): 100 Feet Assistive device: Rolling walker (2 wheels) Gait Pattern/deviations: Step-through pattern, Decreased stride length, Antalgic       General Gait Details: Gait mildly antalgic but improves a little as distance increased. Cues for safety.   Stairs Stairs: Yes Stairs assistance: Contact guard assist Stair Management: Step to pattern, Forwards, One rail Right Number of Stairs: 4 General stair comments: up  and over portable stairs x 2. cues for safety, technique, sequencing.   Wheelchair Mobility     Tilt Bed    Modified Rankin (Stroke Patients Only)       Balance Overall balance assessment: Needs assistance         Standing balance support: Reliant on assistive device for balance, Bilateral upper extremity supported, During functional activity Standing balance-Leahy Scale: Fair                              Cognition Arousal: Alert Behavior During Therapy: WFL for tasks assessed/performed Overall Cognitive Status: Within Functional Limits for tasks assessed                                          Exercises Total Joint Exercises Ankle Circles/Pumps: AROM, Both, 10 reps Quad Sets: AROM, Both, 10 reps Hip ABduction/ADduction: AAROM, Left, 10 reps, AROM Long Arc Quad: AROM, Left, 10 reps Knee Flexion: AROM, Left, 10 reps    General Comments        Pertinent Vitals/Pain Pain Assessment Pain Assessment: 0-10 Pain Score: 5  Pain Location: L hip/thigh Pain Descriptors / Indicators: Discomfort, Sore, Aching Pain Intervention(s): Monitored during session, Ice applied, Repositioned    Home Living                          Prior Function            PT Goals (current goals can  now be found in the care plan section) Progress towards PT goals: Progressing toward goals    Frequency    Min 1X/week      PT Plan      Co-evaluation              AM-PAC PT "6 Clicks" Mobility   Outcome Measure  Help needed turning from your back to your side while in a flat bed without using bedrails?: A Little Help needed moving from lying on your back to sitting on the side of a flat bed without using bedrails?: A Little Help needed moving to and from a bed to a chair (including a wheelchair)?: A Little Help needed standing up from a chair using your arms (e.g., wheelchair or bedside chair)?: A Little Help needed to walk in hospital  room?: A Little Help needed climbing 3-5 steps with a railing? : A Little 6 Click Score: 18    End of Session Equipment Utilized During Treatment: Gait belt Activity Tolerance: Patient tolerated treatment well Patient left: in chair;with call bell/phone within reach   PT Visit Diagnosis: Pain;Other abnormalities of gait and mobility (R26.89) Pain - Right/Left: Left Pain - part of body: Hip     Time: 1191-4782 PT Time Calculation (min) (ACUTE ONLY): 23 min  Charges:    $Gait Training: 8-22 mins $Therapeutic Exercise: 8-22 mins PT General Charges $$ ACUTE PT VISIT: 1 Visit                        Faye Ramsay, PT Acute Rehabilitation  Office: [phone]

## 2023-05-31 DIAGNOSIS — S72142D Displaced intertrochanteric fracture of left femur, subsequent encounter for closed fracture with routine healing: Secondary | ICD-10-CM | POA: Diagnosis not present

## 2023-05-31 DIAGNOSIS — E669 Obesity, unspecified: Secondary | ICD-10-CM | POA: Diagnosis not present

## 2023-05-31 DIAGNOSIS — G4730 Sleep apnea, unspecified: Secondary | ICD-10-CM | POA: Diagnosis not present

## 2023-05-31 DIAGNOSIS — G9009 Other idiopathic peripheral autonomic neuropathy: Secondary | ICD-10-CM | POA: Diagnosis not present

## 2023-05-31 DIAGNOSIS — Z6826 Body mass index (BMI) 26.0-26.9, adult: Secondary | ICD-10-CM | POA: Diagnosis not present

## 2023-05-31 DIAGNOSIS — M8580 Other specified disorders of bone density and structure, unspecified site: Secondary | ICD-10-CM | POA: Diagnosis not present

## 2023-05-31 DIAGNOSIS — I209 Angina pectoris, unspecified: Secondary | ICD-10-CM | POA: Diagnosis not present

## 2023-05-31 DIAGNOSIS — I499 Cardiac arrhythmia, unspecified: Secondary | ICD-10-CM | POA: Diagnosis not present

## 2023-05-31 DIAGNOSIS — E785 Hyperlipidemia, unspecified: Secondary | ICD-10-CM | POA: Diagnosis not present

## 2023-05-31 DIAGNOSIS — I119 Hypertensive heart disease without heart failure: Secondary | ICD-10-CM | POA: Diagnosis not present

## 2023-06-01 NOTE — Discharge Summary (Signed)
Patient ID: Amber Sloan MRN: [id number] DOB/AGE: [birth year] 82 y.o.  Admit date: 05/27/2023 Discharge date: 05/30/2023  Admission Diagnoses:  Principal Problem:   Displaced intertrochanteric fracture of left femur, initial encounter for closed fracture [facility]) Active Problems:   Essential hypertension   Hyperlipidemia   Laryngopharyngeal reflux (LPR)   Closed comminuted intertrochanteric fracture of left femur [facility])   Discharge Diagnoses:  Same  Past Medical History:  Diagnosis Date   Anginal pain (HCC)    had cardiac work up with Dr Allyson Sabal, had a normal Stress test.   Arthritis    Breast mass, right    Hx of (Dr. Parke Simmers)   Chest pain    Constipation    Diverticulitis    Dysrhythmia    Environmental allergies    Family history of heart disease    Hyperlipidemia    pure, continue on Simvastatin   Hypertension    benign, uncontrolled, continue on Benazepril-Hydrochlorothiazide   Menopause    Obesity, unspecified    Osteopenia    on Evista since 2007   Peripheral neuropathy    left   PONV (postoperative nausea and vomiting)    Shortness of breath dyspnea    when walking upastairs    Sleep apnea    on cpap   Squamous cell carcinoma of face     Surgeries: Procedure(s): TOTAL HIP ARTHROPLASTY ANTERIOR APPROACH on 05/28/2023   Consultants: Treatment Team:  Ollen Gross, MD  Discharged Condition: Improved  Hospital Course: Amber Sloan is an 82 y.o. female who was admitted 05/27/2023 for operative treatment ofDisplaced intertrochanteric fracture of left femur, initial encounter for closed fracture (HCC). Patient has severe unremitting pain that affects sleep, daily activities, and work/hobbies. After pre-op clearance the patient was taken to the operating room on 05/28/2023 and underwent  Procedure(s): TOTAL HIP ARTHROPLASTY ANTERIOR APPROACH.    Patient was given perioperative antibiotics:  Anti-infectives (From admission, onward)    Start      Dose/Rate Route Frequency Ordered Stop   05/28/23 1120  ceFAZolin (ANCEF) IVPB 2g/100 mL premix        2 g 200 mL/hr over 30 Minutes Intravenous On call to O.R. 05/28/23 0956 05/28/23 1049   05/28/23 0900  ceFAZolin (ANCEF) powder 2 g  Status:  Discontinued        2 g Other To Surgery 05/28/23 0808 05/28/23 0954   05/28/23 0900  ceFAZolin (ANCEF) 2-4 GM/100ML-% IVPB       Note to Pharmacy: Myrlene Broker M: cabinet override      05/28/23 0900 05/28/23 1109        Patient was given sequential compression devices, early ambulation, and chemoprophylaxis to prevent DVT.  Patient benefited maximally from hospital stay and there were no complications.    Recent vital signs: No data found.   Recent laboratory studies:  Recent Labs    05/30/23 0320  WBC 12.8*  HGB 10.1*  HCT 29.7*  PLT 178  NA 132*  K 3.8  CL 99  CO2 27  BUN 13  CREATININE 0.59  GLUCOSE 117*  CALCIUM 8.1*     Discharge Medications:   Allergies as of 05/30/2023       Reactions   Tape Other (See Comments)   Band-Aids are tolerated, but tape applied in the hospital after gall bladder surgery broke out the skin BADLY (feels only paper tape is safe)   Parafon Forte Dsc [chlorzoxazone] Other (See Comments)   Old allergy unknown reaction   Sulfa Antibiotics  Nausea And Vomiting        Medication List     STOP taking these medications    aspirin EC 81 MG tablet   CAL-MAG-ZINC PO   FLAX SEEDS PO   GARLIC OIL PO   Magnesium 500 MG Caps   metoprolol tartrate 25 MG tablet Commonly known as: LOPRESSOR   multivitamin with minerals Tabs tablet   Turmeric Extra Strength Caps   Vitamin D3 50 MCG (2000 UT) Tabs   zinc gluconate 50 MG tablet       TAKE these medications    acetaminophen 650 MG CR tablet Commonly known as: TYLENOL Take 1,300 mg by mouth at bedtime.   apixaban 2.5 MG Tabs tablet Commonly known as: ELIQUIS Take 1 tablet (2.5 mg total) by mouth every 12 (twelve) hours for 20  days. Then resume one 81 mg aspirin once a day   benazepril-hydrochlorthiazide 10-12.5 MG tablet Commonly known as: LOTENSIN HCT Take 1 tablet by mouth every morning.   docusate sodium 100 MG capsule Commonly known as: Colace Take 1 capsule (100 mg total) by mouth 2 (two) times daily.   HYDROcodone-acetaminophen 5-325 MG tablet Commonly known as: NORCO/VICODIN Take 1-2 tablets by mouth every 6 (six) hours as needed for severe pain (pain score 7-10). Notes to patient: Last given today at 1046   methocarbamol 500 MG tablet Commonly known as: ROBAXIN Take 1 tablet (500 mg total) by mouth every 8 (eight) hours as needed for muscle spasms.   omeprazole 40 MG capsule Commonly known as: PRILOSEC Take 40 mg by mouth daily before breakfast.   polyethylene glycol 17 g packet Commonly known as: MIRALAX / GLYCOLAX Take 17 g by mouth daily.   raloxifene 60 MG tablet Commonly known as: EVISTA Take 60 mg by mouth in the morning.   senna 8.6 MG Tabs tablet Commonly known as: SENOKOT Take 2 tablets (17.2 mg total) by mouth daily as needed for mild constipation.   simvastatin 20 MG tablet Commonly known as: ZOCOR Take 20 mg by mouth at bedtime.               Discharge Care Instructions  (From admission, onward)           Start     Ordered   05/30/23 0000  Weight bearing as tolerated        05/30/23 0721   05/30/23 0000  Change dressing       Comments: You have an adhesive waterproof bandage over the incision. Leave this in place until your first follow-up appointment. Once you remove this you will not need to place another bandage.   05/30/23 0721            Diagnostic Studies: DG Pelvis Portable  Result Date: 05/28/2023 CLINICAL DATA:  Postop. Status post total hip arthroplasty, fracture of left femoral neck. EXAM: PORTABLE PELVIS 1-2 VIEWS COMPARISON:  Preoperative imaging. FINDINGS: Left hip arthroplasty in expected alignment. No periprosthetic lucency or  fracture. Recent postsurgical change includes air and edema in the soft tissues. IMPRESSION: Left hip arthroplasty without immediate postoperative complication. Electronically Signed   By: Narda Rutherford M.D.   On: 05/28/2023 12:54   DG HIP UNILAT WITH PELVIS 1V LEFT  Result Date: 05/28/2023 CLINICAL DATA:  Elective surgery. EXAM: DG HIP (WITH OR WITHOUT PELVIS) 1V*L* COMPARISON:  Preoperative imaging. FINDINGS: Two fluoroscopic spot views of the pelvis and left hip obtained in the operating room. Images during hip arthroplasty. Fluoroscopy time 2 seconds. Dose 0.4965  mGy. IMPRESSION: Intraoperative fluoroscopy during left hip arthroplasty. Electronically Signed   By: Narda Rutherford M.D.   On: 05/28/2023 12:53   DG C-Arm 1-60 Min-No Report  Result Date: 05/28/2023 Fluoroscopy was utilized by the requesting physician.  No radiographic interpretation.   DG C-Arm 1-60 Min-No Report  Result Date: 05/28/2023 Fluoroscopy was utilized by the requesting physician.  No radiographic interpretation.   DG Hip Unilat With Pelvis 2-3 Views Left  Result Date: 05/27/2023 CLINICAL DATA:  Fall are again EXAM: DG HIP (WITH OR WITHOUT PELVIS) 2-3V LEFT COMPARISON:  None Available. FINDINGS: There is an acute fracture through the left femoral neck with mild superolateral displacement of the distal fracture fragment. There is no dislocation. Joint spaces are maintained. Lower lumbar fusion hardware is present. IMPRESSION: Acute fracture through the left femoral neck. Electronically Signed   By: Darliss Cheney M.D.   On: 05/27/2023 16:36   DG Femur 1V Left  Result Date: 05/27/2023 CLINICAL DATA:  Hip pain status post fall. EXAM: LEFT FEMUR 1 VIEW COMPARISON:  Concurrent hip radiographs. MRI of the left knee 05/17/2012. FINDINGS: AP views of the proximal and distal left thigh demonstrate an acute fracture of the left femoral neck with mild superior displacement. The distal femur appears intact as imaged in the  AP projection. Probable degenerative changes at the left knee. No focal soft tissue abnormalities are identified. IMPRESSION: Acute fracture of the left femoral neck. No evidence of distal left femur injury on single AP view. If there are distal symptoms, further evaluation recommended. Electronically Signed   By: Carey Bullocks M.D.   On: 05/27/2023 16:35   DG Chest Port 1 View  Result Date: 05/27/2023 CLINICAL DATA:  Fall. EXAM: PORTABLE CHEST 1 VIEW COMPARISON:  June 21, 2022. FINDINGS: The heart size and mediastinal contours are within normal limits. Mild central pulmonary vascular congestion is noted. No definite consolidative process is noted. The visualized skeletal structures are unremarkable. IMPRESSION: Mild central pulmonary vascular congestion. Electronically Signed   By: Lupita Raider M.D.   On: 05/27/2023 16:34   CT Head Wo Contrast  Result Date: 05/27/2023 CLINICAL DATA:  Head trauma.  Patient fell from stool. EXAM: CT HEAD WITHOUT CONTRAST TECHNIQUE: Contiguous axial images were obtained from the base of the skull through the vertex without intravenous contrast. RADIATION DOSE REDUCTION: This exam was performed according to the departmental dose-optimization program which includes automated exposure control, adjustment of the mA and/or kV according to patient size and/or use of iterative reconstruction technique. COMPARISON:  CT head without contrast 06/21/2022 FINDINGS: Brain: Mild periventricular white matter hypoattenuation is stable, left greater than right. No acute infarct, hemorrhage, or mass lesion is present. Deep brain nuclei are within normal limits. The ventricles are of normal size. No significant extraaxial fluid collection is present. The brainstem and cerebellum are within normal limits. Midline structures are within normal limits. Vascular: Atherosclerotic calcifications are present within the cavernous internal carotid arteries bilaterally. No hyperdense vessel is  present. Skull: Calvarium is intact. No focal lytic or blastic lesions are present. No significant extracranial soft tissue lesion is present. Sinuses/Orbits: Secretions are present in the right sphenoid sinus. The paranasal sinuses and mastoid air cells are otherwise clear. Bilateral lens replacements are noted. Globes and orbits are otherwise unremarkable. IMPRESSION: 1. No acute intracranial abnormality or significant interval change. 2. Stable mild periventricular white matter hypoattenuation, left greater than right. This likely reflects the sequela of chronic microvascular ischemia. 3. Secretions in the right sphenoid sinus.  This may reflect acute sinusitis. Electronically Signed   By: Marin Roberts M.D.   On: 05/27/2023 15:54    Disposition: Discharge disposition: 01-Home or Self Care       Discharge Instructions     Call MD / Call 911   Complete by: As directed    If you experience chest pain or shortness of breath, CALL 911 and be transported to the hospital emergency room.  If you develope a fever above 101 F, pus (white drainage) or increased drainage or redness at the wound, or calf pain, call your surgeon's office.   Change dressing   Complete by: As directed    You have an adhesive waterproof bandage over the incision. Leave this in place until your first follow-up appointment. Once you remove this you will not need to place another bandage.   Constipation Prevention   Complete by: As directed    Drink plenty of fluids.  Prune juice may be helpful.  You may use a stool softener, such as Colace (over the counter) 100 mg twice a day.  Use MiraLax (over the counter) for constipation as needed.   Diet - low sodium heart healthy   Complete by: As directed    Do not sit on low chairs, stoools or toilet seats, as it may be difficult to get up from low surfaces   Complete by: As directed    Driving restrictions   Complete by: As directed    No driving for two weeks    Post-operative opioid taper instructions:   Complete by: As directed    POST-OPERATIVE OPIOID TAPER INSTRUCTIONS: It is important to wean off of your opioid medication as soon as possible. If you do not need pain medication after your surgery it is ok to stop day one. Opioids include: Codeine, Hydrocodone(Norco, Vicodin), Oxycodone(Percocet, oxycontin) and hydromorphone amongst others.  Long term and even short term use of opiods can cause: Increased pain response Dependence Constipation Depression Respiratory depression And more.  Withdrawal symptoms can include Flu like symptoms Nausea, vomiting And more Techniques to manage these symptoms Hydrate well Eat regular healthy meals Stay active Use relaxation techniques(deep breathing, meditating, yoga) Do Not substitute Alcohol to help with tapering If you have been on opioids for less than two weeks and do not have pain than it is ok to stop all together.  Plan to wean off of opioids This plan should start within one week post op of your joint replacement. Maintain the same interval or time between taking each dose and first decrease the dose.  Cut the total daily intake of opioids by one tablet each day Next start to increase the time between doses. The last dose that should be eliminated is the evening dose.      TED hose   Complete by: As directed    Use stockings (TED hose) for three weeks on both leg(s).  You may remove them at night for sleeping.   Weight bearing as tolerated   Complete by: As directed         Follow-up Information     Ollen Gross, MD Follow up on 06/14/2023.   Specialty: Orthopedic Surgery Why: Call [phone] tomorrow for an appointment with Dr Lequita Halt on 12/17 Contact information: [address] [place] [address] [phone]         Ollen Bowl, MD. Schedule an appointment as soon as possible for a visit in 1 week(s).   Specialty: Internal Medicine Why:  with CBC lab to look at blood counts Contact information: [address] [place] [address] [phone]         [facility] Follow up.   Why: [facility]  will provide home physical therapy visits Contact information: [address] [place] [address] [phone]                  Signed: Arther Abbott 06/01/2023, 8:35 AM

## 2023-06-08 DIAGNOSIS — E871 Hypo-osmolality and hyponatremia: Secondary | ICD-10-CM | POA: Diagnosis not present

## 2023-06-08 DIAGNOSIS — D62 Acute posthemorrhagic anemia: Secondary | ICD-10-CM | POA: Diagnosis not present

## 2023-06-08 DIAGNOSIS — Z9989 Dependence on other enabling machines and devices: Secondary | ICD-10-CM | POA: Diagnosis not present

## 2023-07-25 DIAGNOSIS — R310 Gross hematuria: Secondary | ICD-10-CM | POA: Diagnosis not present

## 2023-08-19 DIAGNOSIS — R310 Gross hematuria: Secondary | ICD-10-CM | POA: Diagnosis not present

## 2023-08-19 DIAGNOSIS — R932 Abnormal findings on diagnostic imaging of liver and biliary tract: Secondary | ICD-10-CM | POA: Diagnosis not present

## 2023-09-01 DIAGNOSIS — R310 Gross hematuria: Secondary | ICD-10-CM | POA: Diagnosis not present

## 2023-09-07 DIAGNOSIS — M810 Age-related osteoporosis without current pathological fracture: Secondary | ICD-10-CM | POA: Diagnosis not present

## 2023-09-07 DIAGNOSIS — B372 Candidiasis of skin and nail: Secondary | ICD-10-CM | POA: Diagnosis not present

## 2023-09-07 DIAGNOSIS — I10 Essential (primary) hypertension: Secondary | ICD-10-CM | POA: Diagnosis not present

## 2023-09-07 DIAGNOSIS — K5900 Constipation, unspecified: Secondary | ICD-10-CM | POA: Diagnosis not present

## 2023-09-07 DIAGNOSIS — R310 Gross hematuria: Secondary | ICD-10-CM | POA: Diagnosis not present

## 2023-09-07 DIAGNOSIS — I700 Atherosclerosis of aorta: Secondary | ICD-10-CM | POA: Diagnosis not present

## 2023-09-07 DIAGNOSIS — R935 Abnormal findings on diagnostic imaging of other abdominal regions, including retroperitoneum: Secondary | ICD-10-CM | POA: Diagnosis not present

## 2023-09-07 DIAGNOSIS — K219 Gastro-esophageal reflux disease without esophagitis: Secondary | ICD-10-CM | POA: Diagnosis not present

## 2023-09-07 DIAGNOSIS — E7800 Pure hypercholesterolemia, unspecified: Secondary | ICD-10-CM | POA: Diagnosis not present

## 2023-09-13 ENCOUNTER — Other Ambulatory Visit: Payer: Self-pay | Admitting: Internal Medicine

## 2023-09-13 DIAGNOSIS — Z1231 Encounter for screening mammogram for malignant neoplasm of breast: Secondary | ICD-10-CM

## 2023-09-26 DIAGNOSIS — R935 Abnormal findings on diagnostic imaging of other abdominal regions, including retroperitoneum: Secondary | ICD-10-CM | POA: Diagnosis not present

## 2023-09-26 DIAGNOSIS — K5900 Constipation, unspecified: Secondary | ICD-10-CM | POA: Diagnosis not present

## 2023-09-27 DIAGNOSIS — Z961 Presence of intraocular lens: Secondary | ICD-10-CM | POA: Diagnosis not present

## 2023-09-27 DIAGNOSIS — H33322 Round hole, left eye: Secondary | ICD-10-CM | POA: Diagnosis not present

## 2023-09-27 DIAGNOSIS — H43813 Vitreous degeneration, bilateral: Secondary | ICD-10-CM | POA: Diagnosis not present

## 2023-09-27 DIAGNOSIS — H33193 Other retinoschisis and retinal cysts, bilateral: Secondary | ICD-10-CM | POA: Diagnosis not present

## 2023-10-11 DIAGNOSIS — D175 Benign lipomatous neoplasm of intra-abdominal organs: Secondary | ICD-10-CM | POA: Diagnosis not present

## 2023-10-11 DIAGNOSIS — K6389 Other specified diseases of intestine: Secondary | ICD-10-CM | POA: Diagnosis not present

## 2023-10-11 DIAGNOSIS — R933 Abnormal findings on diagnostic imaging of other parts of digestive tract: Secondary | ICD-10-CM | POA: Diagnosis not present

## 2023-10-11 DIAGNOSIS — K5730 Diverticulosis of large intestine without perforation or abscess without bleeding: Secondary | ICD-10-CM | POA: Diagnosis not present

## 2023-10-13 DIAGNOSIS — L821 Other seborrheic keratosis: Secondary | ICD-10-CM | POA: Diagnosis not present

## 2023-10-13 DIAGNOSIS — D2221 Melanocytic nevi of right ear and external auricular canal: Secondary | ICD-10-CM | POA: Diagnosis not present

## 2023-10-13 DIAGNOSIS — D175 Benign lipomatous neoplasm of intra-abdominal organs: Secondary | ICD-10-CM | POA: Diagnosis not present

## 2023-10-13 DIAGNOSIS — D225 Melanocytic nevi of trunk: Secondary | ICD-10-CM | POA: Diagnosis not present

## 2023-10-13 DIAGNOSIS — D485 Neoplasm of uncertain behavior of skin: Secondary | ICD-10-CM | POA: Diagnosis not present

## 2023-10-13 DIAGNOSIS — L570 Actinic keratosis: Secondary | ICD-10-CM | POA: Diagnosis not present

## 2023-10-13 DIAGNOSIS — L814 Other melanin hyperpigmentation: Secondary | ICD-10-CM | POA: Diagnosis not present

## 2023-10-13 DIAGNOSIS — L578 Other skin changes due to chronic exposure to nonionizing radiation: Secondary | ICD-10-CM | POA: Diagnosis not present

## 2023-10-13 DIAGNOSIS — Z85828 Personal history of other malignant neoplasm of skin: Secondary | ICD-10-CM | POA: Diagnosis not present

## 2023-10-24 DIAGNOSIS — D485 Neoplasm of uncertain behavior of skin: Secondary | ICD-10-CM | POA: Diagnosis not present

## 2023-10-27 ENCOUNTER — Ambulatory Visit
Admission: RE | Admit: 2023-10-27 | Discharge: 2023-10-27 | Disposition: A | Discharge status: Home / Self Care | Source: Ambulatory Visit | Attending: Internal Medicine | Admitting: Internal Medicine

## 2023-10-27 DIAGNOSIS — Z1231 Encounter for screening mammogram for malignant neoplasm of breast: Secondary | ICD-10-CM

## 2023-12-19 DIAGNOSIS — L570 Actinic keratosis: Secondary | ICD-10-CM | POA: Diagnosis not present

## 2023-12-27 DIAGNOSIS — Z961 Presence of intraocular lens: Secondary | ICD-10-CM | POA: Diagnosis not present

## 2023-12-27 DIAGNOSIS — H43813 Vitreous degeneration, bilateral: Secondary | ICD-10-CM | POA: Diagnosis not present

## 2023-12-27 DIAGNOSIS — H33193 Other retinoschisis and retinal cysts, bilateral: Secondary | ICD-10-CM | POA: Diagnosis not present

## 2023-12-27 DIAGNOSIS — H31092 Other chorioretinal scars, left eye: Secondary | ICD-10-CM | POA: Diagnosis not present

## 2024-01-16 ENCOUNTER — Ambulatory Visit: Admitting: Nurse Practitioner

## 2024-02-17 DIAGNOSIS — L570 Actinic keratosis: Secondary | ICD-10-CM | POA: Diagnosis not present

## 2024-02-17 DIAGNOSIS — C44222 Squamous cell carcinoma of skin of right ear and external auricular canal: Secondary | ICD-10-CM | POA: Diagnosis not present

## 2024-03-08 DIAGNOSIS — R300 Dysuria: Secondary | ICD-10-CM | POA: Diagnosis not present

## 2024-03-08 DIAGNOSIS — N3000 Acute cystitis without hematuria: Secondary | ICD-10-CM | POA: Diagnosis not present

## 2024-03-13 DIAGNOSIS — M810 Age-related osteoporosis without current pathological fracture: Secondary | ICD-10-CM | POA: Diagnosis not present

## 2024-03-13 DIAGNOSIS — Z0000 Encounter for general adult medical examination without abnormal findings: Secondary | ICD-10-CM | POA: Diagnosis not present

## 2024-03-13 DIAGNOSIS — C44319 Basal cell carcinoma of skin of other parts of face: Secondary | ICD-10-CM | POA: Diagnosis not present

## 2024-03-13 DIAGNOSIS — K5900 Constipation, unspecified: Secondary | ICD-10-CM | POA: Diagnosis not present

## 2024-03-13 DIAGNOSIS — E7800 Pure hypercholesterolemia, unspecified: Secondary | ICD-10-CM | POA: Diagnosis not present

## 2024-03-13 DIAGNOSIS — G4733 Obstructive sleep apnea (adult) (pediatric): Secondary | ICD-10-CM | POA: Diagnosis not present

## 2024-03-13 DIAGNOSIS — K219 Gastro-esophageal reflux disease without esophagitis: Secondary | ICD-10-CM | POA: Diagnosis not present

## 2024-03-13 DIAGNOSIS — K52832 Lymphocytic colitis: Secondary | ICD-10-CM | POA: Diagnosis not present

## 2024-03-13 DIAGNOSIS — I10 Essential (primary) hypertension: Secondary | ICD-10-CM | POA: Diagnosis not present

## 2024-03-13 DIAGNOSIS — D179 Benign lipomatous neoplasm, unspecified: Secondary | ICD-10-CM | POA: Diagnosis not present

## 2024-03-13 DIAGNOSIS — R7303 Prediabetes: Secondary | ICD-10-CM | POA: Diagnosis not present

## 2024-03-13 DIAGNOSIS — R310 Gross hematuria: Secondary | ICD-10-CM | POA: Diagnosis not present

## 2024-04-02 ENCOUNTER — Encounter: Payer: Self-pay | Admitting: Cardiovascular Disease

## 2024-04-02 ENCOUNTER — Ambulatory Visit: Attending: Cardiovascular Disease | Admitting: Cardiovascular Disease

## 2024-04-02 VITALS — BP 138/72 | HR 70 | Ht 63.5 in | Wt 147.6 lb

## 2024-04-02 DIAGNOSIS — R931 Abnormal findings on diagnostic imaging of heart and coronary circulation: Secondary | ICD-10-CM | POA: Insufficient documentation

## 2024-04-02 DIAGNOSIS — I10 Essential (primary) hypertension: Secondary | ICD-10-CM

## 2024-04-02 DIAGNOSIS — E782 Mixed hyperlipidemia: Secondary | ICD-10-CM

## 2024-04-02 NOTE — Progress Notes (Signed)
 04/02/2024 Amber Sloan   1940-12-18  [id number]  Primary Physician Pahwani, Velna SAUNDERS, MD Primary Cardiologist: Dorn JINNY Lesches MD [facility] [place], [place]  HPI:  Amber Sloan is a 83 y.o.  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 She was referred by Dr. Richardson Lewis for cardiovascular evaluation because of new-onset angina. I last saw her in the office 06/01/2016. Her cardiac risk factors include treated hypertension, and hyperlipidemia. She has a strong family history of heart disease with both parents had myocardial infarctions, brother who died at age 88 of a myocardial infarction at 53 other brothers who have CAD. When I saw her a year ago she had had new onset chest pain. A 2-D echo Myoview stress test were normal after that. In November of last year she underwent cholecystectomy and her symptoms completely resolved. .  Since I saw her 2-1/2 years ago she is remained asymptomatic.  She did fall and fractured her hip earlier this year and had hip replacement.  She specifically denies chest pain or shortness of breath.  She is less than additional 12 pound since I saw her.  She no longer wears or required CPAP.  She did have a coronary calcium score performed 11/02/2021 which was 215 distributed in all 3 coronary arteries.   Current Meds  Medication Sig   aspirin  EC 81 MG tablet Take 81 mg by mouth daily. Swallow whole.   benazepril -hydrochlorthiazide (LOTENSIN  HCT) 10-12.5 MG per tablet Take 1 tablet by mouth every morning.   Calcium Carbonate (CALCIUM 500 PO) Take 1,000 mg by mouth daily.   Cholecalciferol  (VITAMIN D3) 1000 units CAPS Take 2,000 mg by mouth daily.   Chromium-Cinnamon (CINNAMON PLUS CHROMIUM) 2704719994 MCG-MG CAPS Take 1,000 mg by mouth daily.   Flaxseed, Linseed, (FLAXSEED OIL PO) Take 1,400 mg by mouth daily.   Magnesium  Oxide (MAG-CAPS PO) Take 250  mg by mouth daily.   Multiple Vitamins-Minerals (VITRIUM 50+ ADULT-MULTI PO) Take by mouth.   omeprazole (PRILOSEC) 40 MG capsule Take 40 mg by mouth daily before breakfast.   raloxifene  (EVISTA ) 60 MG tablet Take 60 mg by mouth in the morning.   simvastatin  (ZOCOR ) 20 MG tablet Take 20 mg by mouth at bedtime.   TURMERIC EXTRA STRENGTH PO Take 1,000 mg by mouth daily.   Zinc  50 MG TABS Take 50 mg by mouth daily.     Allergies  Allergen Reactions   Tape Other (See Comments)    Band-Aids are tolerated, but tape applied in the hospital after gall bladder surgery broke out the skin BADLY (feels only paper tape is safe)   Parafon Forte Dsc [Chlorzoxazone] Other (See Comments)    Old allergy unknown reaction   Sulfa Antibiotics Nausea And Vomiting    Social History   Socioeconomic History   Marital status: Married    Spouse name: Not on file   Number of children: Not on file   Years of education: Not on file   Highest education level: Not on file  Occupational History   Not on file  Tobacco Use   Smoking status: Never   Smokeless tobacco: Never  Substance and Sexual Activity   Alcohol use: No   Drug use: No   Sexual activity: Not on file  Other Topics Concern   Not on file  Social History Narrative   Not on file   Social Drivers of Health  Financial Resource Strain: Not on file  Food Insecurity: No Food Insecurity (05/27/2023)   Hunger Vital Sign    Worried About Running Out of Food in the Last Year: Never true    Ran Out of Food in the Last Year: Never true  Transportation Needs: No Transportation Needs (05/27/2023)   PRAPARE - Administrator, Civil Service (Medical): No    Lack of Transportation (Non-Medical): No  Physical Activity: Not on file  Stress: Not on file  Social Connections: Not on file  Intimate Partner Violence: Not At Risk (05/27/2023)   Humiliation, Afraid, Rape, and Kick questionnaire    Fear of Current or Ex-Partner: No    Emotionally  Abused: No    Physically Abused: No    Sexually Abused: No     Review of Systems: General: negative for chills, fever, night sweats or weight changes.  Cardiovascular: negative for chest pain, dyspnea on exertion, edema, orthopnea, palpitations, paroxysmal nocturnal dyspnea or shortness of breath Dermatological: negative for rash Respiratory: negative for cough or wheezing Urologic: negative for hematuria Abdominal: negative for nausea, vomiting, diarrhea, bright red blood per rectum, melena, or hematemesis Neurologic: negative for visual changes, syncope, or dizziness All other systems reviewed and are otherwise negative except as noted above.    Blood pressure 138/72, pulse 70, height 5' 3.5 (1.613 m), weight 147 lb 9.6 oz (67 kg), SpO2 98%.  General appearance: alert and no distress Neck: no adenopathy, no carotid bruit, no JVD, supple, symmetrical, trachea midline, and thyroid not enlarged, symmetric, no tenderness/mass/nodules Lungs: clear to auscultation bilaterally Heart: regular rate and rhythm, S1, S2 normal, no murmur, click, rub or gallop Extremities: extremities normal, atraumatic, no cyanosis or edema Pulses: 2+ and symmetric Skin: Skin color, texture, turgor normal. No rashes or lesions Neurologic: Grossly normal  EKG EKG Interpretation Date/Time:  Monday April 02 2024 08:33:02 EDT Ventricular Rate:  70 PR Interval:  140 QRS Duration:  68 QT Interval:  370 QTC Calculation: 399 R Axis:   -2  Text Interpretation: Normal sinus rhythm Low voltage QRS Septal infarct , age undetermined When compared with ECG of 27-May-2023 16:56, PREVIOUS ECG IS PRESENT Confirmed by Court Carrier [phone]) on 04/02/2024 8:49:06 AM    ASSESSMENT AND PLAN:   Essential hypertension History of essential hypertension with blood pressure measured today at 138/72.  She is on benazepril , and hydrochlorothiazide .  Hyperlipidemia History of hyperlipidemia on statin therapy with lipid  profile performed 03/13/2024 revealing total cholesterol 170, LDL 75 and HDL of 61, near goal for secondary prevention.  Elevated coronary artery calcium score History of elevated coronary calcium score performed 11/02/2021 measuring 215 distributed through all 3 coronary arteries.  She is asymptomatic and at goal for secondary prevention.     Carrier DOROTHA Court MD FACP,FACC,FAHA, [facility] 04/02/2024 8:56 AM

## 2024-04-02 NOTE — Assessment & Plan Note (Signed)
 History of hyperlipidemia on statin therapy with lipid profile performed 03/13/2024 revealing total cholesterol 170, LDL 75 and HDL of 61, near goal for secondary prevention.

## 2024-04-02 NOTE — Assessment & Plan Note (Signed)
 History of essential hypertension with blood pressure measured today at 138/72.  She is on benazepril , and hydrochlorothiazide .

## 2024-04-02 NOTE — Patient Instructions (Signed)

## 2024-04-02 NOTE — Assessment & Plan Note (Signed)
 History of elevated coronary calcium score performed 11/02/2021 measuring 215 distributed through all 3 coronary arteries.  She is asymptomatic and at goal for secondary prevention.
